# Patient Record
Sex: Female | Born: 1990 | Race: White | Hispanic: No | Marital: Married | Smoking: Former smoker
Health system: Southern US, Community
[De-identification: ages and names within clinical notes are randomized; demographics above are authoritative.]

## PROBLEM LIST (undated history)

## (undated) DIAGNOSIS — F329 Major depressive disorder, single episode, unspecified: Secondary | ICD-10-CM

## (undated) DIAGNOSIS — F419 Anxiety disorder, unspecified: Secondary | ICD-10-CM

## (undated) DIAGNOSIS — F32A Depression, unspecified: Secondary | ICD-10-CM

## (undated) DIAGNOSIS — C801 Malignant (primary) neoplasm, unspecified: Secondary | ICD-10-CM

## (undated) HISTORY — PX: SKIN CANCER EXCISION: SHX779

## (undated) HISTORY — DX: Malignant (primary) neoplasm, unspecified: C80.1

---
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
## 2008-05-20 ENCOUNTER — Ambulatory Visit: Payer: Self-pay

## 2008-11-11 ENCOUNTER — Ambulatory Visit: Payer: Self-pay | Admitting: Internal Medicine

## 2010-01-05 ENCOUNTER — Ambulatory Visit: Payer: Self-pay | Admitting: Family Medicine

## 2011-03-15 ENCOUNTER — Emergency Department: Payer: Self-pay | Admitting: Unknown Physician Specialty

## 2014-07-14 ENCOUNTER — Ambulatory Visit: Payer: Self-pay | Admitting: Internal Medicine

## 2015-01-17 ENCOUNTER — Ambulatory Visit: Payer: Self-pay | Admitting: Physician Assistant

## 2015-01-17 IMAGING — CR DG KNEE COMPLETE 4+V*R*
4 series · 4 of 4 positions shown · non-contrast
Comparison: None.

CLINICAL DATA: Knee pain since an injury while skiing 2 days ago.

EXAM:
RIGHT KNEE - COMPLETE 4+ VIEW

[knee ap]
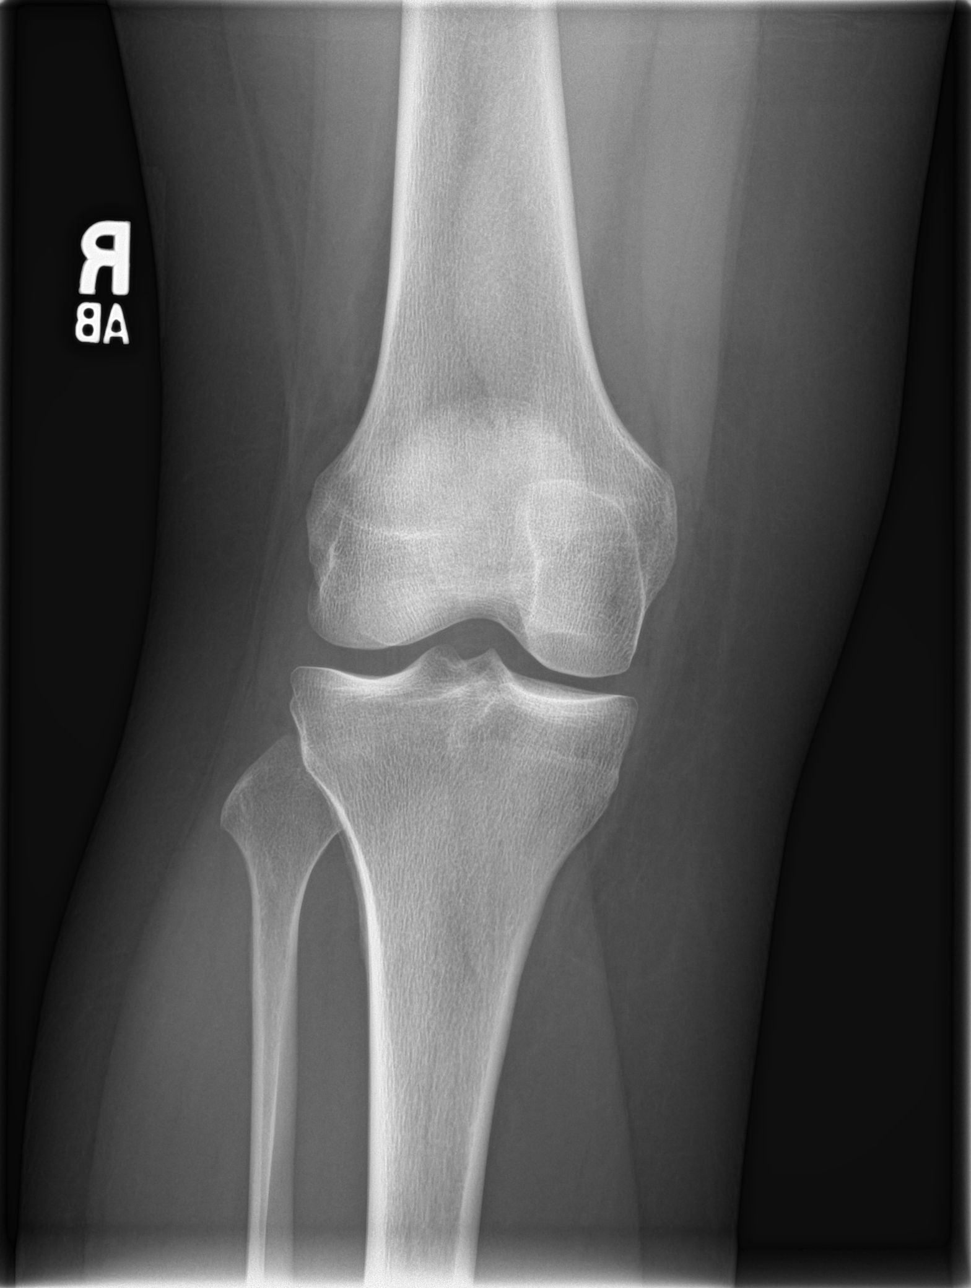

[knee obl (1 of 2)]
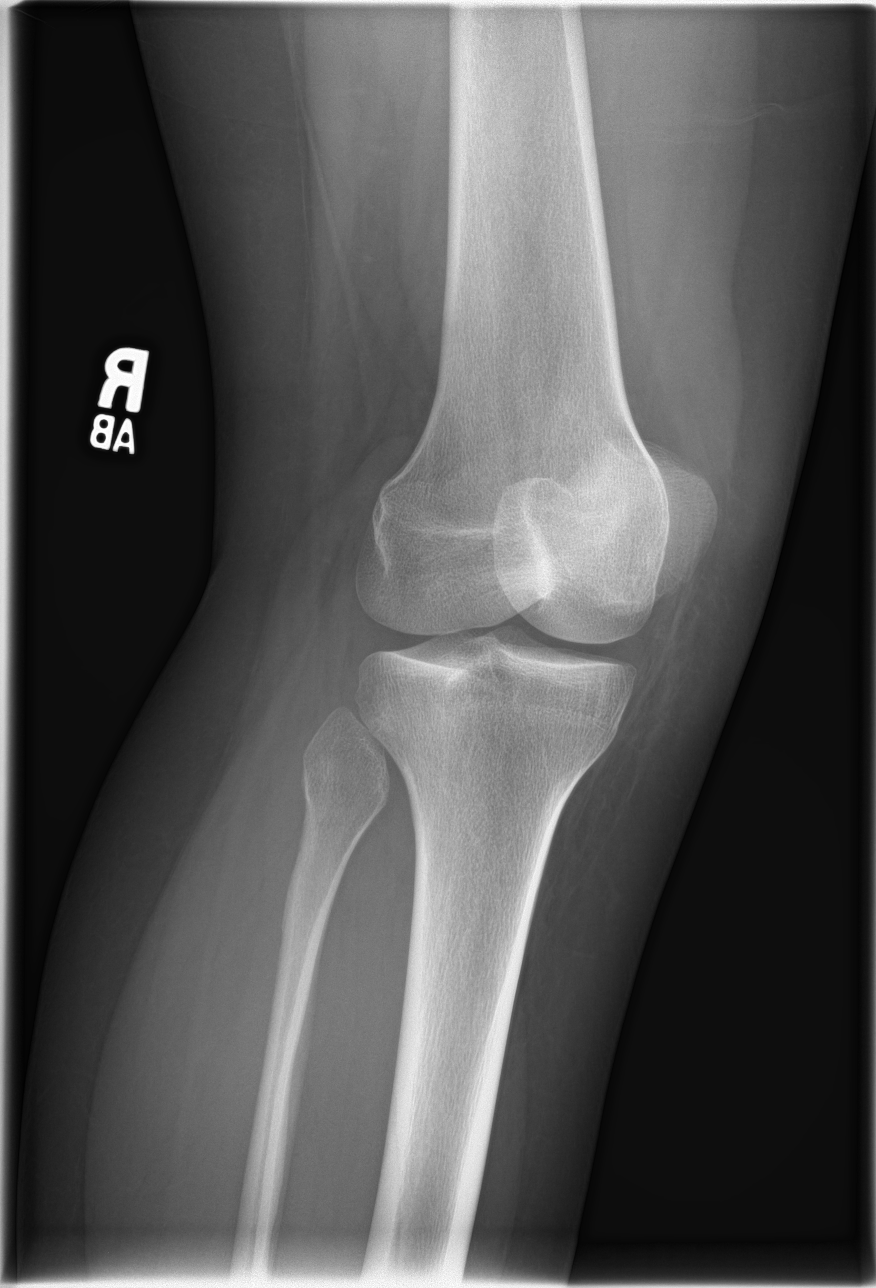

[knee obl (2 of 2)]
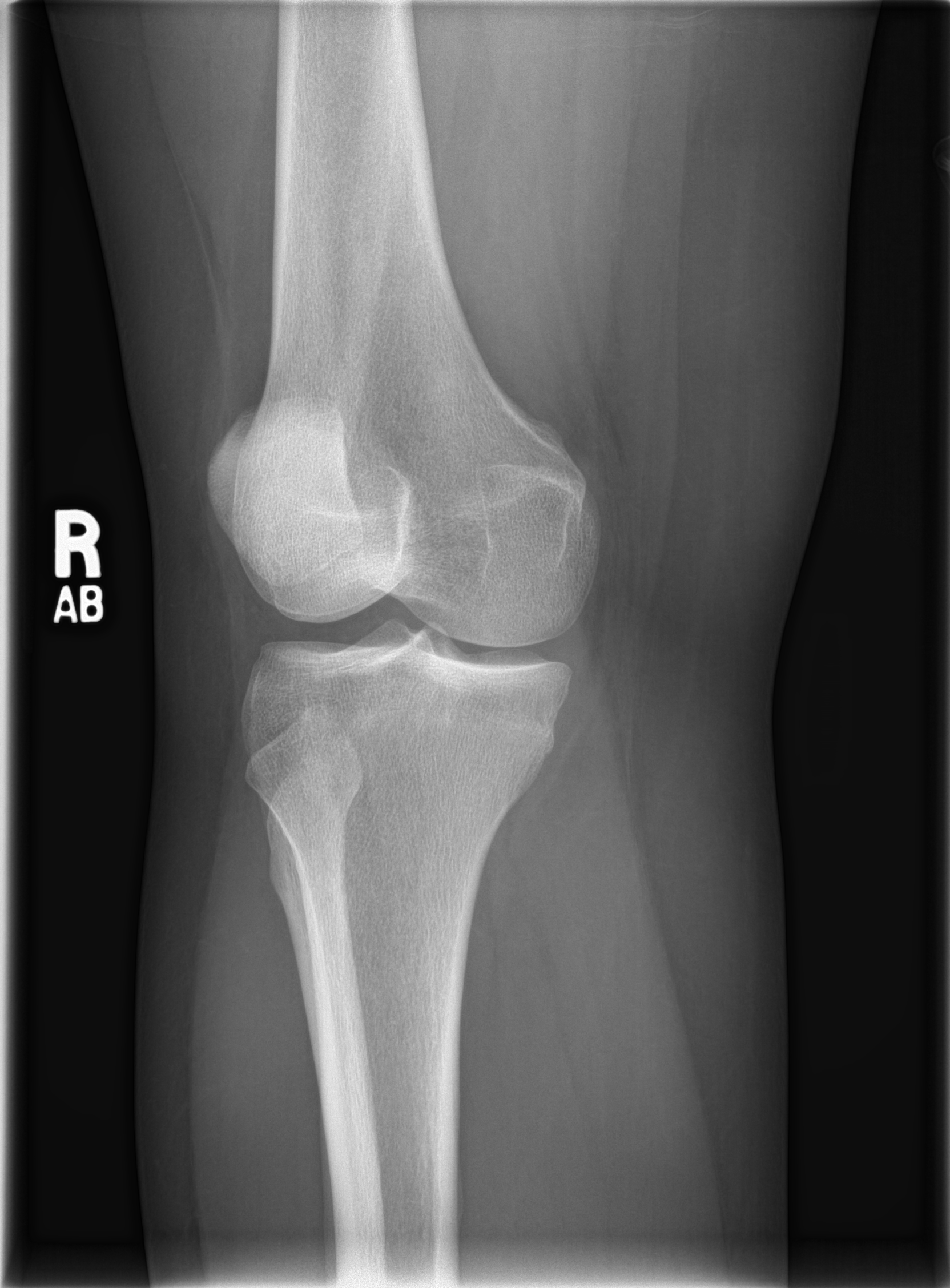

[knee lat]
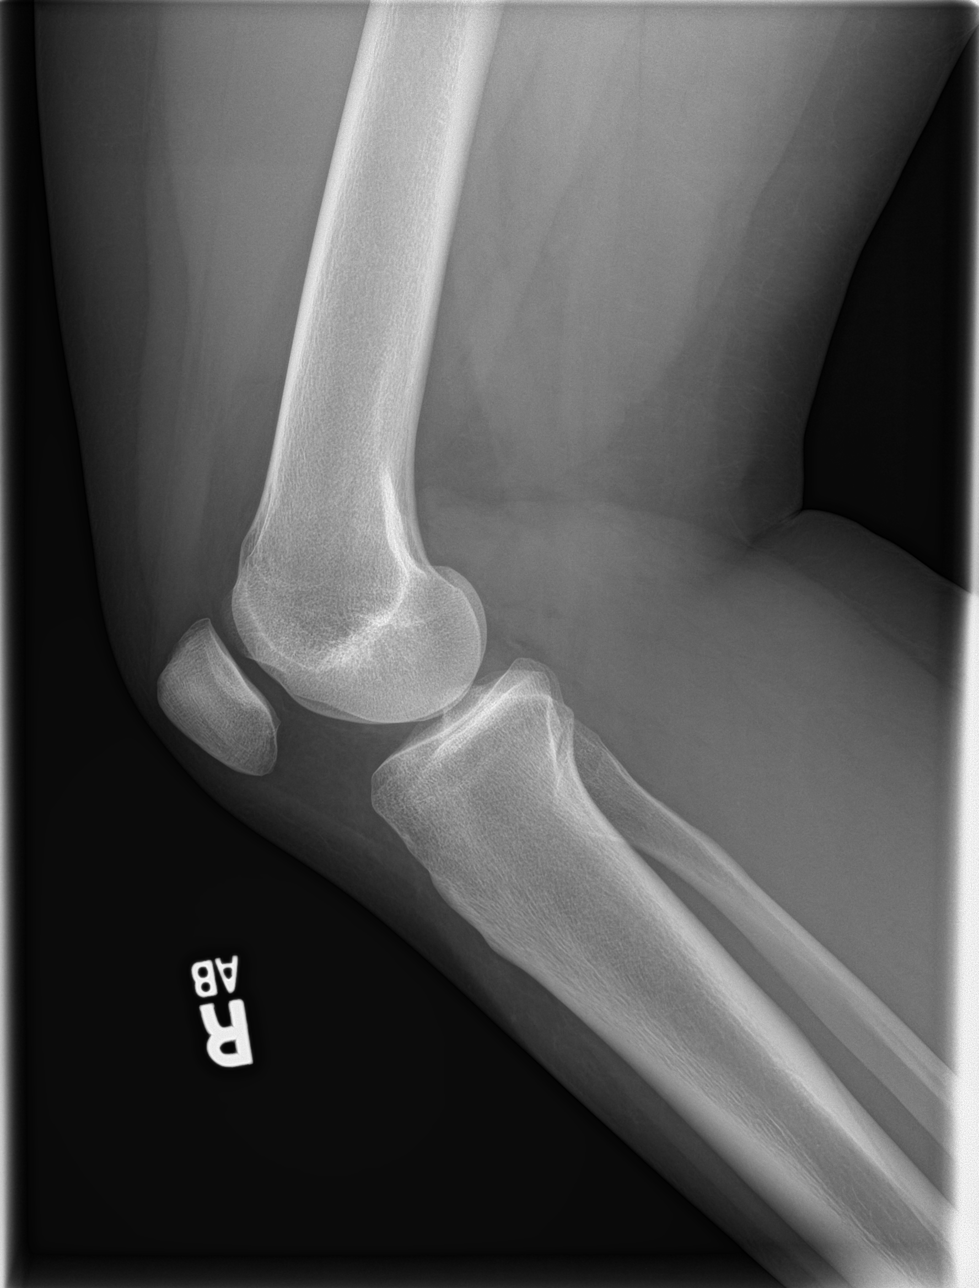

[4 of 4 positions shown; findings below may reference images not displayed]

FINDINGS: There is no evidence of fracture, dislocation, or joint effusion.
There is no evidence of arthropathy or other focal bone abnormality.
Soft tissues are unremarkable.
IMPRESSION: Normal exam.

## 2015-01-20 ENCOUNTER — Ambulatory Visit: Payer: Self-pay | Admitting: Physician Assistant

## 2015-03-31 ENCOUNTER — Encounter
Admit: 2015-03-31 | Disposition: A | Discharge status: Home / Self Care | Payer: Self-pay | Attending: Specialist | Admitting: Specialist

## 2015-04-01 ENCOUNTER — Encounter
Admit: 2015-04-01 | Disposition: A | Discharge status: Home / Self Care | Payer: Self-pay | Attending: Specialist | Admitting: Specialist

## 2015-04-11 ENCOUNTER — Encounter: Payer: Self-pay | Admitting: *Deleted

## 2015-05-03 ENCOUNTER — Encounter: Payer: Self-pay | Admitting: Physical Therapy

## 2015-05-05 ENCOUNTER — Encounter: Payer: Self-pay | Admitting: Physical Therapy

## 2015-05-09 ENCOUNTER — Encounter: Payer: Self-pay | Admitting: Physical Therapy

## 2015-09-05 ENCOUNTER — Emergency Department: Payer: 59

## 2015-09-05 ENCOUNTER — Encounter: Payer: Self-pay | Admitting: Emergency Medicine

## 2015-09-05 ENCOUNTER — Emergency Department
Admission: EM | Admit: 2015-09-05 | Discharge: 2015-09-05 | Disposition: A | Discharge status: Home / Self Care | Payer: 59 | Attending: Emergency Medicine | Admitting: Emergency Medicine

## 2015-09-05 DIAGNOSIS — R1012 Left upper quadrant pain: Secondary | ICD-10-CM | POA: Diagnosis present

## 2015-09-05 DIAGNOSIS — R109 Unspecified abdominal pain: Secondary | ICD-10-CM | POA: Insufficient documentation

## 2015-09-05 DIAGNOSIS — Z720 Tobacco use: Secondary | ICD-10-CM | POA: Insufficient documentation

## 2015-09-05 DIAGNOSIS — Z3202 Encounter for pregnancy test, result negative: Secondary | ICD-10-CM | POA: Insufficient documentation

## 2015-09-05 LAB — URINALYSIS COMPLETE WITH MICROSCOPIC (ARMC ONLY)
BILIRUBIN URINE: NEGATIVE
Glucose, UA: NEGATIVE mg/dL
KETONES UR: NEGATIVE mg/dL
LEUKOCYTES UA: NEGATIVE
NITRITE: NEGATIVE
PH: 5 (ref 5.0–8.0)
Protein, ur: NEGATIVE mg/dL
Specific Gravity, Urine: 1.024 (ref 1.005–1.030)

## 2015-09-05 LAB — CBC
HCT: 40.5 % (ref 35.0–47.0)
Hemoglobin: 13.9 g/dL (ref 12.0–16.0)
MCH: 32.1 pg (ref 26.0–34.0)
MCHC: 34.3 g/dL (ref 32.0–36.0)
MCV: 93.9 fL (ref 80.0–100.0)
PLATELETS: 270 10*3/uL (ref 150–440)
RBC: 4.31 MIL/uL (ref 3.80–5.20)
RDW: 12.1 % (ref 11.5–14.5)
WBC: 10.5 10*3/uL (ref 3.6–11.0)

## 2015-09-05 LAB — COMPREHENSIVE METABOLIC PANEL
ALK PHOS: 73 U/L (ref 38–126)
ALT: 21 U/L (ref 14–54)
AST: 26 U/L (ref 15–41)
Albumin: 4.6 g/dL (ref 3.5–5.0)
Anion gap: 7 (ref 5–15)
BILIRUBIN TOTAL: 0.4 mg/dL (ref 0.3–1.2)
BUN: 10 mg/dL (ref 6–20)
CALCIUM: 9.6 mg/dL (ref 8.9–10.3)
CO2: 24 mmol/L (ref 22–32)
CREATININE: 0.88 mg/dL (ref 0.44–1.00)
Chloride: 106 mmol/L (ref 101–111)
GFR calc Af Amer: >60 mL/min (ref >60)
Glucose, Bld: 123 mg/dL — ABNORMAL HIGH (ref 65–99)
Potassium: 3.5 mmol/L (ref 3.5–5.1)
Sodium: 137 mmol/L (ref 135–145)
TOTAL PROTEIN: 7.4 g/dL (ref 6.5–8.1)

## 2015-09-05 LAB — LIPASE, BLOOD: Lipase: 28 U/L (ref 22–51)

## 2015-09-05 LAB — POCT PREGNANCY, URINE: Preg Test, Ur: NEGATIVE

## 2015-09-05 MED ORDER — GI COCKTAIL ~~LOC~~: 30.0000 mL | Freq: Once | ORAL | Status: DC

## 2015-09-05 MED ORDER — IOHEXOL 240 MG/ML SOLN
25.0000 mL | Freq: Once | INTRAMUSCULAR | Status: AC | PRN
  Administered 2015-09-05: 25 mL via ORAL

## 2015-09-05 MED ORDER — IOHEXOL 350 MG/ML SOLN
100.0000 mL | Freq: Once | INTRAVENOUS | Status: AC | PRN
  Administered 2015-09-05: 100 mL via INTRAVENOUS

## 2015-09-05 MED ORDER — KETOROLAC TROMETHAMINE 30 MG/ML IJ SOLN
INTRAMUSCULAR | Status: AC
  Administered 2015-09-05: 30 mg via INTRAVENOUS
  Filled 2015-09-05: qty 1

## 2015-09-05 MED ORDER — ONDANSETRON HCL 4 MG/2ML IJ SOLN
4.0000 mg | Freq: Once | INTRAMUSCULAR | Status: AC
  Administered 2015-09-05: 4 mg via INTRAVENOUS
  Filled 2015-09-05: qty 2

## 2015-09-05 MED ORDER — KETOROLAC TROMETHAMINE 30 MG/ML IJ SOLN
30.0000 mg | Freq: Once | INTRAMUSCULAR | Status: AC
  Administered 2015-09-05: 30 mg via INTRAVENOUS

## 2015-09-05 MED ORDER — ONDANSETRON HCL 4 MG/2ML IJ SOLN
INTRAMUSCULAR | Status: AC
Start: 2015-09-05 — End: 2015-09-05
  Administered 2015-09-05: 4 mg via INTRAVENOUS
  Filled 2015-09-05: qty 2

## 2015-09-05 NOTE — ED Provider Notes (Signed)
[facility] Emergency Department Provider Note  ____________________________________________  Time seen: Approximately 5:28 PM  I have reviewed the triage vital signs and the nursing notes.   HISTORY  Chief Complaint Abdominal Pain    HPI Mandy Hughes is a 25 y.o. female patient is had about 2 weeks of intermittent sharp stabbing left upper quadrant pain that what occurred when she was up and moving but went away when she laid down. For the last 3 days this has become more constant again and she is up and moving around but resolves when she lays down. The pain is now radiating to the right shoulder and biceps area. Patient has previously tried toms a GI cocktail and Zantac without any relief. Patient is not really short of breath with this pain. She really does not have a cough with this pain. Eating doesn't seem to affect it either. Patient does smoke and use birth control.   History reviewed. No pertinent past medical history.  There are no active problems to display for this patient.   History reviewed. No pertinent past surgical history.  No current outpatient prescriptions on file.  Allergies Tape  History reviewed. No pertinent family history.  Social History Social History  Substance Use Topics  . Smoking status: Current Every Day Smoker  . Smokeless tobacco: None  . Alcohol Use: Yes    Review of Systems Constitutional: No fever/chills Eyes: No visual changes. ENT: No sore throat. Cardiovascular: See history of present illness Respiratory: Denies shortness of breath. Gastrointestinal: See history of present illness.  No nausea, no vomiting.  No diarrhea.  No constipation. Genitourinary: Negative for dysuria. Musculoskeletal: Negative for back pain. Skin: Negative for rash. Neurological: Negative for headaches, focal weakness or numbness.  10-point ROS otherwise  negative.  ____________________________________________   PHYSICAL EXAM:  VITAL SIGNS: ED Triage Vitals  Enc Vitals Group     BP 09/05/15 1706 144/87 mmHg     Pulse Rate 09/05/15 1706 117     Resp 09/05/15 1706 18     Temp 09/05/15 1706 98 F (36.7 C)     Temp Source 09/05/15 1706 Oral     SpO2 09/05/15 1706 98 %     Weight 09/05/15 1706 200 lb (90.719 kg)     Height 09/05/15 1706 5\' 3"  (1.6 m)     Head Cir --      Peak Flow --      Pain Score 09/05/15 1708 3     Pain Loc --      Pain Edu? --      Excl. in [place]? --     Constitutional: Alert and oriented. Well appearing and in no acute distress. Eyes: Conjunctivae are normal. PERRL. EOMI. Head: Atraumatic. Nose: No congestion/rhinnorhea. Mouth/Throat: Mucous membranes are moist.  Oropharynx non-erythematous. Neck: No stridor.   Cardiovascular: Normal rate, regular rhythm. Grossly normal heart sounds.  Good peripheral circulation. Respiratory: Normal respiratory effort.  No retractions. Lungs CTAB. Gastrointestinal: Soft and nontender. No distention. No abdominal bruits. No CVA tenderness. Musculoskeletal: No lower extremity tenderness nor edema.  No joint effusions. Neurologic:  Normal speech and language. No gross focal neurologic deficits are appreciated. No gait instability. Skin:  Skin is warm, dry and intact. No rash noted. Psychiatric: Mood and affect are normal. Speech and behavior are normal.  ____________________________________________   LABS (all labs ordered are listed, but only abnormal results are displayed)  Labs Reviewed  COMPREHENSIVE METABOLIC PANEL - Abnormal; Notable for the following:  Glucose, Bld 123 (*)    All other components within normal limits  URINALYSIS COMPLETEWITH MICROSCOPIC ([facility] ONLY) - Abnormal; Notable for the following:    Color, Urine YELLOW (*)    APPearance HAZY (*)    Hgb urine dipstick 1+ (*)    Bacteria, UA RARE (*)    Squamous Epithelial / LPF 0-5 (*)    All other  components within normal limits  LIPASE, BLOOD  CBC  TROPONIN I  PREGNANCY, URINE  URINALYSIS COMPLETEWITH MICROSCOPIC ([facility] ONLY)  POCT PREGNANCY, URINE   ____________________________________________  EKG  EKG read and interpreted by me shows normal sinus rhythm rate of 98 normal axis no acute changes ____________________________________________  RADIOLOGY  CT of the chest and belly show essentially nothing acute going on. There is a small hypodensity presumed to be benign by the radiologist in the liver. ____________________________________________   PROCEDURES   ____________________________________________   INITIAL IMPRESSION / ASSESSMENT AND PLAN / ED COURSE  Pertinent labs & imaging results that were available during my care of the patient were reviewed by me and considered in my medical decision making (see chart for details).  Discussed the labs and radiology studies with the patient in detail will give her something for nausea if needed and we'll try some Toradol 14 times a day for next 4 days around the clock whether it's hurting or not to see if that relieves any possible inflammation or muscle strain she may be having ____________________________________________   FINAL CLINICAL IMPRESSION(S) / ED DIAGNOSES  Final diagnoses:  Flank pain      Nena Polio, MD 09/05/15 2113

## 2015-09-05 NOTE — ED Notes (Signed)
Reports LUQ pain for several days.  Improves temporarily after eating, today radiating to [employer] area

## 2015-09-05 NOTE — Discharge Instructions (Signed)
Abdominal Pain, Women °Abdominal (stomach, pelvic, or belly) pain can be caused by many things. It is important to tell your doctor: °· The location of the pain. °· Does it come and go or is it present all the time? °· Are there things that start the pain (eating certain foods, exercise)? °· Are there other symptoms associated with the pain (fever, nausea, vomiting, diarrhea)? °All of this is helpful to know when trying to find the cause of the pain. °CAUSES  °· Stomach: virus or bacteria infection, or ulcer. °· Intestine: appendicitis (inflamed appendix), regional ileitis (Crohn's disease), ulcerative colitis (inflamed colon), irritable bowel syndrome, diverticulitis (inflamed diverticulum of the colon), or cancer of the stomach or intestine. °· Gallbladder disease or stones in the gallbladder. °· Kidney disease, kidney stones, or infection. °· Pancreas infection or cancer. °· Fibromyalgia (pain disorder). °· Diseases of the female organs: °¨ Uterus: fibroid (non-cancerous) tumors or infection. °¨ Fallopian tubes: infection or tubal pregnancy. °¨ Ovary: cysts or tumors. °¨ Pelvic adhesions (scar tissue). °¨ Endometriosis (uterus lining tissue growing in the pelvis and on the pelvic organs). °¨ Pelvic congestion syndrome (female organs filling up with blood just before the menstrual period). °¨ Pain with the menstrual period. °¨ Pain with ovulation (producing an egg). °¨ Pain with an IUD (intrauterine device, birth control) in the uterus. °¨ Cancer of the female organs. °· Functional pain (pain not caused by a disease, may improve without treatment). °· Psychological pain. °· Depression. °DIAGNOSIS  °Your doctor will decide the seriousness of your pain by doing an examination. °· Blood tests. °· X-rays. °· Ultrasound. °· CT scan (computed tomography, special type of X-ray). °· MRI (magnetic resonance imaging). °· Cultures, for infection. °· Barium enema (dye inserted in the large intestine, to better view it with  X-rays). °· Colonoscopy (looking in intestine with a lighted tube). °· Laparoscopy (minor surgery, looking in abdomen with a lighted tube). °· Major abdominal exploratory surgery (looking in abdomen with a large incision). °TREATMENT  °The treatment will depend on the cause of the pain.  °· Many cases can be observed and treated at home. °· Over-the-counter medicines recommended by your caregiver. °· Prescription medicine. °· Antibiotics, for infection. °· Birth control pills, for painful periods or for ovulation pain. °· Hormone treatment, for endometriosis. °· Nerve blocking injections. °· Physical therapy. °· Antidepressants. °· Counseling with a psychologist or psychiatrist. °· Minor or major surgery. °HOME CARE INSTRUCTIONS  °· Do not take laxatives, unless directed by your caregiver. °· Take over-the-counter pain medicine only if ordered by your caregiver. Do not take aspirin because it can cause an upset stomach or bleeding. °· Try a clear liquid diet (broth or water) as ordered by your caregiver. Slowly move to a bland diet, as tolerated, if the pain is related to the stomach or intestine. °· Have a thermometer and take your temperature several times a day, and record it. °· Bed rest and sleep, if it helps the pain. °· Avoid sexual intercourse, if it causes pain. °· Avoid stressful situations. °· Keep your follow-up appointments and tests, as your caregiver orders. °· If the pain does not go away with medicine or surgery, you may try: °¨ Acupuncture. °¨ Relaxation exercises (yoga, meditation). °¨ Group therapy. °¨ Counseling. °SEEK MEDICAL CARE IF:  °· You notice certain foods cause stomach pain. °· Your home care treatment is not helping your pain. °· You need stronger pain medicine. °· You want your IUD removed. °· You feel faint or   lightheaded.  You develop nausea and vomiting.  You develop a rash.  You are having side effects or an allergy to your medicine. SEEK IMMEDIATE MEDICAL CARE IF:   Your  pain does not go away or gets worse.  You have a fever.  Your pain is felt only in portions of the abdomen. The right side could possibly be appendicitis. The left lower portion of the abdomen could be colitis or diverticulitis.  You are passing blood in your stools (bright red or black tarry stools, with or without vomiting).  You have blood in your urine.  You develop chills, with or without a fever.  You pass out. MAKE SURE YOU:   Understand these instructions.  Will watch your condition.  Will get help right away if you are not doing well or get worse. Document Released: 10/14/2007 Document Revised: 05/03/2014 Document Reviewed: 11/03/2009 [facility] Patient Information 2015 [place], [place]. This information is not intended to replace advice given to you by your health care provider. Make sure you discuss any questions you have with your health care provider. Try the Toradol 10 mg one pill 4 times a day with food for the next 3-4 days with her having the pain or not. Return for worse pain fever vomiting or any new symptoms including shortness of breath. Stop smoking! Please follow-up with your doctor to be sure nothing new changes. Do not take the Toradol with Motrin meloxicam Naprosyn or any other non-steroidal. Use Zofran ODT as needed for nausea

## 2015-11-11 ENCOUNTER — Encounter: Payer: Self-pay | Admitting: Emergency Medicine

## 2015-11-11 ENCOUNTER — Emergency Department
Admission: EM | Admit: 2015-11-11 | Discharge: 2015-11-12 | Disposition: A | Discharge status: Home / Self Care | Payer: 59 | Attending: Emergency Medicine | Admitting: Emergency Medicine

## 2015-11-11 DIAGNOSIS — H578 Other specified disorders of eye and adnexa: Secondary | ICD-10-CM | POA: Insufficient documentation

## 2015-11-11 DIAGNOSIS — H44001 Unspecified purulent endophthalmitis, right eye: Secondary | ICD-10-CM

## 2015-11-11 DIAGNOSIS — Z720 Tobacco use: Secondary | ICD-10-CM | POA: Insufficient documentation

## 2015-11-11 DIAGNOSIS — H5711 Ocular pain, right eye: Secondary | ICD-10-CM | POA: Diagnosis present

## 2015-11-11 NOTE — ED Notes (Signed)
Pt states she was watching TV and her eye started burning about 830pm. Pt states after work she took her contacts out and makeup off and about an hour later her eye started burning.

## 2015-11-12 MED ORDER — CIPROFLOXACIN HCL 0.3 % OP SOLN: 1.0000 [drp] | OPHTHALMIC | Status: AC

## 2015-11-12 NOTE — Discharge Instructions (Signed)
Bacterial Conjunctivitis    Bacterial conjunctivitis (commonly called pink eye) is redness, soreness, or puffiness (inflammation) of the white part of your eye. It is caused by a germ called bacteria. These germs can easily spread from person to person (contagious). Your eye often will become red or pink. Your eye may also become irritated, watery, or have a thick discharge.  HOME CARE  Apply a cool, clean washcloth over closed eyelids. Do this for 10-20 minutes, 3-4 times a day while you have pain.  Gently wipe away any fluid coming from the eye with a warm, wet washcloth or cotton ball.  Wash your hands often with soap and water. Use paper towels to dry your hands.  Do not share towels or washcloths.  Change or wash your pillowcase every day.  Do not use eye makeup until the infection is gone.  Do not use machines or drive if your vision is blurry.  Stop using contact lenses. Do not use them again until your doctor says it is okay.  Do not touch the tip of the eye drop bottle or medicine tube with your fingers when you put medicine on the eye. GET HELP RIGHT AWAY IF:  Your eye is not better after 3 days of starting your medicine.  You have a yellowish fluid coming out of the eye.  You have more pain in the eye.  Your eye redness is spreading.  Your vision becomes blurry.  You have a fever or lasting symptoms for more than 2-3 days.  You have a fever and your symptoms suddenly get worse.  You have pain in the face.  Your face gets red or puffy (swollen). MAKE SURE YOU:  Understand these instructions.  Will watch this condition.  Will get help right away if you are not doing well or get worse. This information is not intended to replace advice given to you by your health care provider. Make sure you discuss any questions you have with your health care provider.  Document Released: 09/25/2008 Document Revised: 12/03/2012 Document Reviewed: 08/22/2012  Elsevier Interactive Patient  Education 2016 [employer].   How to Use Eye Drops and Eye Ointments  HOW TO APPLY EYE DROPS  Follow these steps when applying eye drops:  1. Wash your hands. 2. Tilt your head back. 3. Put a finger under your eye and use it to gently pull your lower lid downward. Keep that finger in place. 4. Using your other hand, hold the dropper between your thumb and index finger. 5. Position the dropper just over the edge of the lower lid. Hold it as close to your eye as you can without touching the dropper to your eye. 6. Steady your hand. One way to do this is to lean your index finger against your brow. 7. Look up. 8. Slowly and gently squeeze one drop of medicine into your eye. 9. Close your eye. 10. Place a finger between your lower eyelid and your nose. Press gently for 2 minutes. This increases the amount of time that the medicine is exposed to the eye. It also reduces side effects that can develop if the drop gets into the bloodstream through the nose. HOW TO APPLY EYE OINTMENTS  Follow these steps when applying eye ointments:  1. Wash your hands. 2. Put a finger under your eye and use it to gently pull your lower lid downward. Keep that finger in place. 3. Using your other hand, place the tip of the tube between your thumb  and index finger with the remaining fingers braced against your cheek or nose. 4. Hold the tube just over the edge of your lower lid without touching the tube to your lid or eyeball. 5. Look up. 6. Line the inner part of your lower lid with ointment. 7. Gently pull up on your upper lid and look down. This will force the ointment to spread over the surface of the eye. 8. Release the upper lid. 9. If you can, close your eyes for 1-2 minutes. Do not rub your eyes. If you applied the ointment correctly, your vision will be blurry for a few minutes. This is normal.  ADDITIONAL INFORMATION  Make sure to use the eye drops or ointment as told by your health care provider.  If  you have been told to use both eye drops and an eye ointment, apply the eye drops first, then wait 3-4 minutes before you apply the ointment.  Try not to touch the tip of the dropper or tube to your eye. A dropper or tube that has touched the eye can become contaminated. This information is not intended to replace advice given to you by your health care provider. Make sure you discuss any questions you have with your health care provider.  Document Released: 03/25/2001 Document Revised: 05/03/2015 Document Reviewed: 12/13/2014  Elsevier Interactive Patient Education [employer].

## 2015-11-12 NOTE — ED Provider Notes (Signed)
[facility] Emergency Department Provider Note  ____________________________________________  Time seen: Approximately 10 PM 11/11/2015  I have reviewed the triage vital signs and the nursing notes.   HISTORY  Chief Complaint Eye Injury    HPI Mandy Hughes is a 25 y.o. female who presents to the emergency department complaining of right eye pain. The patient states that she was at home this evening watching TV when her eye began to "burn". She states that symptoms began approximately 8:30 this evening. She states that initially she believed it was either dry eyes or irritation from her contact lenses. She states that multiple blinking and rubbing of her eyes did not improve symptoms so she removed her contact. She states at that time she felt like there was "something flapping on my eye." She states that she also began to experience visual acuity changes to the right eye. 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    Patient denies any head trauma or eye trauma in the recent past. She denies any fevers or chills. She denies headache, left-sided visual acuity changes, fevers or chills, URI symptoms. Patient does wear contact lenses but states that entire lens was removed this evening. She reports that contacts are prolonged wear but she removes same every evening. She states that at this time the symptoms are best described as a burning sensation as well as a foreign body sensation. She has tried flushing eye with no relief.Pain and symptoms are constant, not relieved with anything but aggravated with eye movement and bright lights.   History reviewed. No pertinent past medical history.  There are no active problems to display for this patient.   History reviewed. No pertinent past surgical history.  Current Outpatient Rx  Name  Route  Sig  Dispense   Refill  . ciprofloxacin (CILOXAN) 0.3 % ophthalmic solution   Right Eye   Place 1 drop into the right eye every 2 (two) hours. Administer 1 drop, every 2 hours, while awake, for 2 days. Then 1 drop, every 4 hours, while awake, for the next 5 days.   5 mL   0     Allergies Tape  History reviewed. No pertinent family history.  Social History Social History  Substance Use Topics  . Smoking status: Current Every Day Smoker  . Smokeless tobacco: None  . Alcohol Use: Yes    Review of Systems Constitutional: No fever/chills Eyes: Endorses right-sided visual changes. Endorses right eye pain. ENT: No sore throat. Endorses right eye pain. Cardiovascular: Denies chest pain. Respiratory: Denies shortness of breath. Gastrointestinal: No abdominal pain.  No nausea, no vomiting.  No diarrhea.  No constipation. Genitourinary: Negative for dysuria. Musculoskeletal: Negative for back pain. Skin: Negative for rash. Neurological: Negative for headaches, focal weakness or numbness.  10-point ROS otherwise negative.  ____________________________________________   PHYSICAL EXAM:  VITAL SIGNS: ED Triage Vitals  Enc Vitals Group     BP 11/11/15 2238 144/81 mmHg     Pulse Rate 11/11/15 2238 95     Resp 11/11/15 2238 18     Temp 11/11/15 2238 98.2 F (36.8 C)     Temp Source 11/11/15 2238 Oral     SpO2 11/11/15 2238 98 %     Weight --      Height --      Head Cir --      Peak  Flow --      Pain Score 11/11/15 2239 9     Pain Loc --      Pain Edu? --      Excl. in [place]? --     Constitutional: Alert and oriented. Well appearing and in no acute distress. Eyes: Conjunctival inflammation noted upon initial inspection to right eye when compared with left. No visible drainage. Initial funduscopic exam reveals viscous fluid to lateral right globe. No visible foreign body is appreciated. No retained contact is visualized.. Red reflex intact. Optic vasculature and optic nerve with no acute  abnormality visualized. Tetracaine eyedrops were applied for local anesthetic purposes. Flouroscein staining reveals no acute area of uptake. PERRL. EOMI. Head: Atraumatic. Nose: No congestion/rhinnorhea. Mouth/Throat: Mucous membranes are moist.  Oropharynx non-erythematous. Neck: No stridor.   Hematological/Lymphatic/Immunilogical: No cervical lymphadenopathy. Cardiovascular: Normal rate, regular rhythm. Grossly normal heart sounds.  Good peripheral circulation. Respiratory: Normal respiratory effort.  No retractions. Lungs CTAB. Gastrointestinal: Soft and nontender. No distention. No abdominal bruits. No CVA tenderness. Musculoskeletal: No lower extremity tenderness nor edema.  No joint effusions. Neurologic:  Normal speech and language. No gross focal neurologic deficits are appreciated. No gait instability. Skin:  Skin is warm, dry and intact. No rash noted. Psychiatric: Mood and affect are normal. Speech and behavior are normal.  ____________________________________________   LABS (all labs ordered are listed, but only abnormal results are displayed)  Labs Reviewed - No data to display ____________________________________________  EKG   ____________________________________________  RADIOLOGY   ____________________________________________   PROCEDURES  Procedure(s) performed: None  Critical Care performed: No  ____________________________________________   INITIAL IMPRESSION / ASSESSMENT AND PLAN / ED COURSE  Pertinent labs & imaging results that were available during my care of the patient were reviewed by me and considered in my medical decision making (see chart for details).  The patient's history, symptoms, physical exam are taken and consideration with diagnosis. Patient presents with sudden onset of right eye pain and redness to the emergency department. The patient is a contact wearer. Patient states that entire contact was removed prior to arrival. Patient  is legally blind and right eye and uses prescription lenses at all times for distance vision. Patient was unable to read eye chart with uncorrected vision to right eye. Per the patient this is baseline for her. Patient was requested to use prescriptive glasses for visual acuity exam. Patient has a decrease in distance vision to 20/40 vision in right eye. Patient also reports that objects are "blurry". Patient's symptoms were concerning to the provider and further consultation with attending Dr. Joni Fears as well as ophthalmology was consulted by phone. Dr. Joni Fears examined patient using slit lamp. Dr. Jerene Canny exam was consistent with this providers with likely vitriolic fluid to the lateral globe. No acute changes to vasculature or optic disc were appreciated. Significant signs of inflammation were appreciated. Dr.Brasington with ophthalmology was contacted and advised of patient's symptoms, physical exam findings, and Concerta provider. Dr. Wallace Going advise provider to place patient on antibiotic eyedrops and have follow-up in the morning at 10:00 for further evaluation and treatment.  The patient was informed of findings and diagnosis. She verbalizes understanding of the diagnosis. I advised the patient to follow-up with Dr. Murvin Natal in the morning for further evaluation and treatment and she verbalizes understanding and compliance with same. The patient is not to use her contact lenses until ophthalmology visit. Patient verbalizes understanding of this. Patient discharged home with antibiotics. She is advised to use saline eyedrops for  additional symptomatic control until she can seem ophthalmology. ____________________________________________   FINAL CLINICAL IMPRESSION(S) / ED DIAGNOSES  Final diagnoses:  Eye infection, right      Darletta Moll, PA-C 11/12/15 0112  Carrie Mew, MD 11/12/15 [phone]

## 2015-11-12 NOTE — ED Notes (Signed)
Pt dc home ambulatory instructed on follow up plan and med use PT NAD AT DC

## 2015-12-20 ENCOUNTER — Ambulatory Visit: Payer: 59 | Attending: Orthopedic Surgery

## 2015-12-20 DIAGNOSIS — M25561 Pain in right knee: Secondary | ICD-10-CM | POA: Diagnosis present

## 2015-12-20 NOTE — Therapy (Signed)
[place] MAIN [facility] SERVICES [address] [place], [place], [address] Phone: [phone]   Fax:  [phone]  Physical Therapy Evaluation  Patient Details  Name: Alba Twedt MRN: [id number] Date of Birth: 1990/09/20 Referring Provider: Mack Guise   Encounter Date: 12/20/2015      PT End of Session - 12/20/15 0909    Visit Number 1   Number of Visits 17   Date for PT Re-Evaluation 01/17/16   PT Start Time 0805   PT Stop Time 0900   PT Time Calculation (min) 55 min   Equipment Utilized During Treatment --  R knee brace with lateral block\   Activity Tolerance Patient tolerated treatment well;No increased pain   Behavior During Therapy [facility] for tasks assessed/performed      Past Medical History  Diagnosis Date  . Cancer ([place])     basal cell carcinoma    No past surgical history on file.  There were no vitals filed for this visit.  Visit Diagnosis:  Right knee pain - Plan: PT plan of care cert/re-cert      Subjective Assessment - 12/20/15 0810    Subjective Pt reports she tried skiing almost a year ago where she began having medial knee pain. she was diagnosied with a grade 2 MCL tear. she has had some medial knee pain since then. Pt reports she started physical therapy and then started having anterior knee pain she reports with most movements. pt reports squatting and going up stairs increases her pain. pt denies any numbness/tlinging. she is wearing a knee brace.    Limitations Standing;Other (comment)  squatting   How long can you stand comfortably? 2hrs   Diagnostic tests MRI last year- grade 2 MCL sprain   Patient Stated Goals be able to work without pain. squat without pian   Currently in Pain? Yes   Pain Score 3    Pain Location --  R knee   Pain Descriptors / Indicators Burning            [facility] PT Assessment - 12/20/15 0816    Assessment   Medical Diagnosis R patellofemoral sydrome   Referring  Provider Mack Guise    Onset Date/Surgical Date 01/19/14   Precautions   Precautions None   Restrictions   Weight Bearing Restrictions No   Balance Screen   Has the patient fallen in the past 6 months No   Has the patient had a decrease in activity level because of a fear of falling?  No   Is the patient reluctant to leave their home because of a fear of falling?  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 [employer]   Sensation   Light Touch Appears Intact   Ambulation/Gait   Gait Pattern Lateral hip instability;Lateral trunk lean to left          PROM/AROM: WNL bilaterally  STRENGTH:  Graded on a 0-5 scale Muscle Group Left Right  Shoulder flex    Shoulder Abd    Shoulder Ext    Shoulder IR/ER    Elbow    Wrist/hand     Hip Flex 4 4  Hip Abd 4 4  Hip Add 4- 4-  Hip Ext 4 4-  Hip IR/ER 4- 4-  Knee Flex 4/ 4  Knee Ext 4 4  Ankle DF 5 5  Ankle PF 5 5   SENSATION: WNL  SPECIAL TESTS: McMurreys + R lachmans (-) Patellar compression test+ Lateral glide of patella noted with quad set R>L Bilateral patella lateral tilt Delayed VMO R>L (-) Obers   FUNCTIONAL MOBILITY: Hip IR with descending steps R>L  BALANCE:  Knee valgus/hip IR/ER with SLS R>L  palpation: Medial joint line of R knee tender                    PT Education - 12/20/15 0909    Education provided Yes   Education Details pathology, prognosis, POC   Person(s) Educated Patient   Methods Explanation   Comprehension Verbalized understanding             PT Long Term Goals - 12/20/15 0914    PT LONG TERM GOAL #1   Title pt will reduce LEFS to <25/80 disability, significantly improving function    Baseline 53/80    Time 8   Period Weeks   Status New   PT LONG TERM GOAL #2   Title pt will be independent and compliant with HEP   Time 4   Period Weeks   Status New   PT LONG TERM GOAL #3   Title pt will be able to tolerate working a shift with less than 3/10 R knee pain   Baseline 8/10   Time 8   Period Weeks   Status New   PT LONG TERM GOAL #4   Title pt will ascend and descend steps in step over step pattern with pain <3/10 and no knee valgus demonstrating improved strength    Time 8   Period Weeks   Status New               Plan - 12/20/15 0909    Clinical Impression Statement pt presents with chronic R knee pain at this point following the original ski injury 1 year ago. pt demonstrates bilateral R>L quad, glut, glut med weakness along with single leg instability with knee valgus. pt does have + McMurrys test on the R knee with popping palpated medially. pt would benefit from skilled PT services to address pain, weakness, instability, and patellar hypomobility.    Pt will benefit from skilled therapeutic intervention in order to improve on the following deficits Abnormal gait;Decreased strength;Hypomobility;Decreased activity tolerance;Pain   Rehab Potential Good   PT Frequency 2x / week   PT Duration 8 weeks   PT Treatment/Interventions Electrical Stimulation;Iontophoresis 4mg /ml Dexamethasone;Moist Heat;Ultrasound;Neuromuscular re-education;Balance training;Therapeutic exercise;Therapeutic activities;Functional mobility training;Stair training;Gait training;Manual techniques;Dry needling;Passive range of motion;Taping   PT Next Visit Plan closed chain strengthening         Problem List There are no active problems to display for this patient.  Gorden Harms. Fortunato Nordin, PT, DPT [phone]  Bryston Colocho 12/20/2015, 9:17 AM  [place] MAIN [facility] SERVICES [address] [place], [place], [address] Phone: [phone]   Fax:   [phone]  Name: Edilia Newbrough MRN: [id number] Date of Birth: 1990-05-23

## 2015-12-20 NOTE — Patient Instructions (Signed)
[email or web address] SLR with ER 2x15 Bridges 5s hold x 30 Adductor squeeze + quad set x 30 Lateral side steps green band x 30 steps each way

## 2015-12-29 ENCOUNTER — Ambulatory Visit: Payer: 59

## 2016-01-01 HISTORY — PX: LIPOMA EXCISION: SHX5283

## 2016-01-05 ENCOUNTER — Ambulatory Visit: Payer: 59 | Attending: Orthopedic Surgery

## 2016-01-05 DIAGNOSIS — M25561 Pain in right knee: Secondary | ICD-10-CM | POA: Insufficient documentation

## 2016-01-05 DIAGNOSIS — Z01419 Encounter for gynecological examination (general) (routine) without abnormal findings: Secondary | ICD-10-CM | POA: Diagnosis not present

## 2016-01-05 DIAGNOSIS — Z3041 Encounter for surveillance of contraceptive pills: Secondary | ICD-10-CM | POA: Diagnosis not present

## 2016-01-05 DIAGNOSIS — Z124 Encounter for screening for malignant neoplasm of cervix: Secondary | ICD-10-CM | POA: Diagnosis not present

## 2016-01-05 DIAGNOSIS — Z720 Tobacco use: Secondary | ICD-10-CM | POA: Diagnosis not present

## 2016-01-05 NOTE — Therapy (Signed)
[place] MAIN [facility] SERVICES [address] [place], [place], [address] Phone: [phone]   Fax:  [phone]  Physical Therapy Treatment  Patient Details  Name: Mandy Hughes MRN: [id number] Date of Birth: 07/21/1990 Referring Provider: Mack Guise   Encounter Date: 01/05/2016      PT End of Session - 01/05/16 0959    Visit Number 2   Number of Visits 17   Date for PT Re-Evaluation 01/17/16   PT Start Time 0802   PT Stop Time [id number]   PT Time Calculation (min) 42 min   Equipment Utilized During Treatment --  R knee brace with lateral block\   Activity Tolerance Patient tolerated treatment well;No increased pain   Behavior During Therapy [facility] for tasks assessed/performed      Past Medical History  Diagnosis Date  . Cancer ([place])     basal cell carcinoma    History reviewed. No pertinent past surgical history.  There were no vitals filed for this visit.  Visit Diagnosis:  Right knee pain      Subjective Assessment - 01/05/16 0956    Subjective pt reports she has been partially compliant with HEP- performing it 2x/week. she reports her knee has felt a little better at work.    Limitations Standing;Other (comment)  squatting   How long can you stand comfortably? 2hrs   Diagnostic tests MRI last year- grade 2 MCL sprain   Patient Stated Goals be able to work without pain. squat without pian   Currently in Pain? Yes   Pain Score 2    Pain Location --  knee   Pain Orientation Right      Nustep L 4 warm up no charge x 2 min Fwd step up with knee up no UE 6in 2x10 each leg- Lateral step down (eccentric) 4inch step 4x5  Sit to stand (to improve squat mechanics) with band around knees (red) 3x10 Side steps with red band around knees 75m x 3 laps SLS on AIREX: knee flexed to 90 deg 30s x 5 each leg TKE with ball on wall 3s x 15 each leg Clamshells 2x10 bilaterally Pt needs mod cues to reduce knee valgus and for proper  squat mechanics all exercises performed bilaterally                           PT Education - 01/05/16 0959    Education provided Yes   Education Details importance of compliance of HEP. importance of hip, quad, glut strength for knee pain and stability    Person(s) Educated Patient   Methods Explanation   Comprehension Verbalized understanding             PT Long Term Goals - 12/20/15 0914    PT LONG TERM GOAL #1   Title pt will reduce LEFS to <25/80 disability, significantly improving function    Baseline 53/80   Time 8   Period Weeks   Status New   PT LONG TERM GOAL #2   Title pt will be independent and compliant with HEP   Time 4   Period Weeks   Status New   PT LONG TERM GOAL #3   Title pt will be able to tolerate working a shift with less than 3/10 R knee pain   Baseline 8/10   Time 8   Period Weeks   Status New   PT LONG TERM GOAL #4   Title pt will  ascend and descend steps in step over step pattern with pain <3/10 and no knee valgus demonstrating improved strength    Time 8   Period Weeks   Status New               Plan - 01/05/16 1000    Clinical Impression Statement pt had mild knee pain with closed chain LE exercises, but was tolerable. pt needed min-mod cues for proper exercise performance with appropriate muscle activation. she reported accurate areas of muscle fatigue at end of session. encouraged her to be compliant with HEP   Pt will benefit from skilled therapeutic intervention in order to improve on the following deficits Abnormal gait;Decreased strength;Hypomobility;Decreased activity tolerance;Pain   Rehab Potential Good   PT Frequency 2x / week   PT Duration 8 weeks   PT Treatment/Interventions Electrical Stimulation;Iontophoresis 4mg /ml Dexamethasone;Moist Heat;Ultrasound;Neuromuscular re-education;Balance training;Therapeutic exercise;Therapeutic activities;Functional mobility training;Stair training;Gait  training;Manual techniques;Dry needling;Passive range of motion;Taping   PT Next Visit Plan closed chain strengthening        Problem List There are no active problems to display for this patient.  Gorden Harms. Amia Rynders, PT, DPT [phone]  Sherod Cisse 01/05/2016, 10:55 AM  [place] MAIN [facility] SERVICES [address] [place], [place], [address] Phone: [phone]   Fax:  [phone]  Name: Mandy Hughes MRN: [id number] Date of Birth: 01/07/1990

## 2016-01-09 ENCOUNTER — Ambulatory Visit: Payer: 59

## 2016-01-11 ENCOUNTER — Ambulatory Visit: Payer: 59

## 2016-01-11 DIAGNOSIS — M25561 Pain in right knee: Secondary | ICD-10-CM

## 2016-01-11 NOTE — Therapy (Signed)
[place] MAIN [facility] SERVICES [address] [place], [place], [address] Phone: [phone]   Fax:  [phone]  Physical Therapy Treatment  Patient Details  Name: Mandy Hughes MRN: [id number] Date of Birth: 11-08-1990 Referring Provider: Mack Guise   Encounter Date: 01/11/2016      PT End of Session - 01/11/16 1007    Visit Number 3   Number of Visits 17   Date for PT Re-Evaluation 01/17/16   PT Start Time 0905   PT Stop Time 1000   PT Time Calculation (min) 55 min   Equipment Utilized During Treatment --  R knee brace with lateral block\   Activity Tolerance Patient tolerated treatment well;No increased pain   Behavior During Therapy [facility] for tasks assessed/performed      Past Medical History  Diagnosis Date  . Cancer ([place])     basal cell carcinoma    History reviewed. No pertinent past surgical history.  There were no vitals filed for this visit.  Visit Diagnosis:  Right knee pain      Subjective Assessment - 01/11/16 1006    Subjective pt reports she is doing most of her HEP. she reports DOMS after last session. she reports muscle soreness in gluts, hamstrings and quads.   Limitations Standing;Other (comment)  squatting   How long can you stand comfortably? 2hrs   Diagnostic tests MRI last year- grade 2 MCL sprain   Patient Stated Goals be able to work without pain. squat without pian   Currently in Pain? No/denies      Rec bike x 2 min L 2 warm up Leg press- single leg 75lbs 2x12  Butt burners: Standing clamshells red band 5x5 Side steps x 10 m x 5 laps with red band Squat to stand on toes with red band 5x5  Lateral eccentric step downs 4inch step 3x10 bilaterally cues to reduce hip IR/knee valgus and keeping knee behind toes (more hip hinge) SLS on AIREX with ball toss knee flexed to 90 deg 3x15 each side Side lunge on BOSU x 15 each side  3 step jog / hold/land SLS on trampoline 2x1 min cues for  increased knee flexion at landing Tball bridge with bend knee 2x10 Full plank 10s x 5 cues for proper performance.  Pt requires min-mod verbal and tactile cues for proper exercise performance    piriformis stretch 30s x 2 Quad stretch 30s x 2                         PT Education - 01/11/16 1006    Education provided Yes   Education Details importance of LE strength to improve patellar tracking    Person(s) Educated Patient   Methods Explanation   Comprehension Verbalized understanding             PT Long Term Goals - 12/20/15 0914    PT LONG TERM GOAL #1   Title pt will reduce LEFS to <25/80 disability, significantly improving function    Baseline 53/80   Time 8   Period Weeks   Status New   PT LONG TERM GOAL #2   Title pt will be independent and compliant with HEP   Time 4   Period Weeks   Status New   PT LONG TERM GOAL #3   Title pt will be able to tolerate working a shift with less than 3/10 R knee pain   Baseline 8/10   Time  8   Period Weeks   Status New   PT LONG TERM GOAL #4   Title pt will ascend and descend steps in step over step pattern with pain <3/10 and no knee valgus demonstrating improved strength    Time 8   Period Weeks   Status New               Plan - 01/11/16 1007    Clinical Impression Statement pt demonstrates improved LE stability R>L with SLS and able to tolerate progression of LE stability and strengthening activities. she does still demonstrate glut, quad, hip, core and hamstring weakness.    Pt will benefit from skilled therapeutic intervention in order to improve on the following deficits Abnormal gait;Decreased strength;Hypomobility;Decreased activity tolerance;Pain   Rehab Potential Good   PT Frequency 2x / week   PT Duration 8 weeks   PT Treatment/Interventions Electrical Stimulation;Iontophoresis 4mg /ml Dexamethasone;Moist Heat;Ultrasound;Neuromuscular re-education;Balance training;Therapeutic  exercise;Therapeutic activities;Functional mobility training;Stair training;Gait training;Manual techniques;Dry needling;Passive range of motion;Taping   PT Next Visit Plan closed chain strengthening        Problem List There are no active problems to display for this patient.  Gorden Harms. Roemello Speyer, PT, DPT [phone]  Ivyrose Hashman 01/11/2016, 10:14 AM  [place] MAIN [facility] SERVICES [address] [place], [place], [address] Phone: [phone]   Fax:  [phone]  Name: Carlota Merwin MRN: [id number] Date of Birth: 07-28-1990

## 2016-01-12 ENCOUNTER — Ambulatory Visit: Payer: 59

## 2016-01-12 DIAGNOSIS — M25561 Pain in right knee: Secondary | ICD-10-CM

## 2016-01-12 NOTE — Therapy (Signed)
[place] MAIN [facility] SERVICES [address] [place], [place], [address] Phone: [phone]   Fax:  [phone]  Physical Therapy Treatment  Patient Details  Name: Mandy Hughes MRN: [id number] Date of Birth: Feb 02, 1990 Referring Provider: Mack Guise   Encounter Date: 01/12/2016      PT End of Session - 01/12/16 1015    Visit Number 4   Number of Visits 17   Date for PT Re-Evaluation 01/17/16   PT Start Time 0920   PT Stop Time 1000   PT Time Calculation (min) 40 min   Equipment Utilized During Treatment --  R knee brace with lateral block\   Activity Tolerance Patient tolerated treatment well;No increased pain   Behavior During Therapy [facility] for tasks assessed/performed      Past Medical History  Diagnosis Date  . Cancer ([place])     basal cell carcinoma    No past surgical history on file.  There were no vitals filed for this visit.  Visit Diagnosis:  Right knee pain      Subjective Assessment - 01/12/16 1010    Subjective pt reports her gluts, quads and back are sore after yesterdays session, mediated by heating pad. pt reports her knee doesnt bother her as much as it used to.    Limitations Standing;Other (comment)  squatting   How long can you stand comfortably? 2hrs   Diagnostic tests MRI last year- grade 2 MCL sprain   Patient Stated Goals be able to work without pain. squat without pian   Currently in Pain? Yes   Pain Score 3    Pain Location --  lower back      Therex: Nustep L 3 x 3 min LE only Resisted sideways walking 17lbs x 5 laps in mini squat Resisted retro walking 25lbs x 5 laps Fwd step up onto BOSU - finger tip for balance 3x10 each leg cues to reduce valgus collapse BOSU squat flat top up 2x10 cues for hip hinge and upright posture  Standing on BOSU in mini squat :athletic position" flat top down with med ball toss outside BOS (yellow med ball) 3x10 Standing resisted hip flexion, abduction  ,adduction and extension with   green     Band x 15 each way no UE support performed bilaterally. Cues for core stability Lateral eccentric step downs 3x10 on 4inch step performed bilaterally- cues to keep knee behind toes, cues to reduce knee valgus.                            PT Education - 01/11/16 1006    Education provided Yes   Education Details importance of LE strength to improve patellar tracking    Person(s) Educated Patient   Methods Explanation   Comprehension Verbalized understanding             PT Long Term Goals - 12/20/15 0914    PT LONG TERM GOAL #1   Title pt will reduce LEFS to <25/80 disability, significantly improving function    Baseline 53/80   Time 8   Period Weeks   Status New   PT LONG TERM GOAL #2   Title pt will be independent and compliant with HEP   Time 4   Period Weeks   Status New   PT LONG TERM GOAL #3   Title pt will be able to tolerate working a shift with less than 3/10 R knee pain  Baseline 8/10   Time 8   Period Weeks   Status New   PT LONG TERM GOAL #4   Title pt will ascend and descend steps in step over step pattern with pain <3/10 and no knee valgus demonstrating improved strength    Time 8   Period Weeks   Status New               Plan - 01/12/16 1015    Clinical Impression Statement pt continues to do well with progression of therapy exercises. she does still struggle to control valugs collapse R>L, however is able to correct with cuing. progressing exercises onto more unstable surfaces to improve LE stability. pt demonstrates likely core weakness with most exercises with difficulty stabilizing her trunk which may be the cause of her LBP.    Pt will benefit from skilled therapeutic intervention in order to improve on the following deficits Abnormal gait;Decreased strength;Hypomobility;Decreased activity tolerance;Pain   Rehab Potential Good   PT Frequency 2x / week   PT Duration 8 weeks   PT  Treatment/Interventions Electrical Stimulation;Iontophoresis 4mg /ml Dexamethasone;Moist Heat;Ultrasound;Neuromuscular re-education;Balance training;Therapeutic exercise;Therapeutic activities;Functional mobility training;Stair training;Gait training;Manual techniques;Dry needling;Passive range of motion;Taping   PT Next Visit Plan closed chain strengthening        Problem List There are no active problems to display for this patient.  Gorden Harms. Mandy Hughes, PT, DPT [phone]  Mandy Hughes 01/12/2016, 10:17 AM  [place] MAIN [facility] SERVICES [address] [place], [place], [address] Phone: [phone]   Fax:  [phone]  Name: Mandy Hughes MRN: [id number] Date of Birth: 1990-07-23

## 2016-01-17 ENCOUNTER — Ambulatory Visit: Payer: 59

## 2016-01-17 DIAGNOSIS — M25561 Pain in right knee: Secondary | ICD-10-CM | POA: Diagnosis not present

## 2016-01-17 NOTE — Therapy (Signed)
[place] MAIN [facility] SERVICES [address] [place], [place], [address] Phone: [phone]   Fax:  [phone]  Physical Therapy Treatment  Patient Details  Name: Mandy Hughes MRN: [id number] Date of Birth: October 27, 1990 Referring Provider: Mack Guise   Encounter Date: 01/17/2016      PT End of Session - 01/17/16 1104    Visit Number 5   Number of Visits 17   Date for PT Re-Evaluation 01/17/16   PT Start Time 1005   PT Stop Time 1045   PT Time Calculation (min) 40 min   Equipment Utilized During Treatment --  R knee brace with lateral block\   Activity Tolerance Patient tolerated treatment well;No increased pain   Behavior During Therapy [facility] for tasks assessed/performed      Past Medical History  Diagnosis Date  . Cancer ([place])     basal cell carcinoma    No past surgical history on file.  There were no vitals filed for this visit.  Visit Diagnosis:  Right knee pain      Subjective Assessment - 01/17/16 1054    Subjective Pt reports she has been having improved R anterior knee pain since starting therapy. She noted some increased knee soreness yesterday after a long shift but has since resolved. No reported pain at this time. Pt reports she is performing HEP without issue. No specific questions or concerns at this time.    Limitations Standing;Other (comment)  squatting   How long can you stand comfortably? 2hrs   Diagnostic tests MRI last year- grade 2 MCL sprain   Patient Stated Goals be able to work without pain. squat without pian   Currently in Pain? No/denies       Therex: Nustep 3 x 4 min during history; Resisted sideways GTB 6 laps x 2 with squats between steps; R single leg bridges with L hip flexion to avoid excessive lumbar flexion 2 x 10; Side knee planks 10 second hold, 15 sec rest x 3; BOSU forward and lateral lunges with 8# dumbbell 2 x 10 each; BOSU squat flat top up 2 x 10 cues for hip hinge and  upright posture   Standing on BOSU in mini squat :athletic position" flat top up with med ball toss outside BOS (yellow med ball) 60 seconds x 2 challenging pt to increase her depth of squat; Lateral eccentric step downs x10 on 6inch step performed with R stance, pt struggles with RLE instability weakness, cues to reduce knee valgus In hooklying assessed R knee medial and lateral stability and well as ACL integrity. No evidence of ligamentous instability noted; Palpation over lateral and distal R quadriceps is nontender without reproduction of anterior knee pain; Quantum R single leg press 90# 3 x 10;                           PT Education - 01/17/16 1102    Education provided Yes   Education Details Reinforced HEP   Person(s) Educated Patient   Methods Explanation   Comprehension Verbalized understanding             PT Long Term Goals - 12/20/15 0914    PT LONG TERM GOAL #1   Title pt will reduce LEFS to <25/80 disability, significantly improving function    Baseline 53/80   Time 8   Period Weeks   Status New   PT LONG TERM GOAL #2   Title pt will  be independent and compliant with HEP   Time 4   Period Weeks   Status New   PT LONG TERM GOAL #3   Title pt will be able to tolerate working a shift with less than 3/10 R knee pain   Baseline 8/10   Time 8   Period Weeks   Status New   PT LONG TERM GOAL #4   Title pt will ascend and descend steps in step over step pattern with pain <3/10 and no knee valgus demonstrating improved strength    Time 8   Period Weeks   Status New               Plan - 01/17/16 1105    Clinical Impression Statement Pt demonstrates good progress with therapy. She continues to demonstrate poor RLE stability in single leg stance with golfer pick-up and eccentric lateral step-downs. Pt encouraged to continue HEP and follow-up as scheduled.    Pt will benefit from skilled therapeutic intervention in order to improve on  the following deficits Abnormal gait;Decreased strength;Hypomobility;Decreased activity tolerance;Pain   Rehab Potential Good   PT Frequency 2x / week   PT Duration 8 weeks   PT Treatment/Interventions Electrical Stimulation;Iontophoresis 4mg /ml Dexamethasone;Moist Heat;Ultrasound;Neuromuscular re-education;Balance training;Therapeutic exercise;Therapeutic activities;Functional mobility training;Stair training;Gait training;Manual techniques;Dry needling;Passive range of motion;Taping   PT Next Visit Plan Repeat outcome measures/recert, closed chain strengthening   PT Home Exercise Plan As prescribed        Problem List There are no active problems to display for this patient.  Phillips Grout PT, DPT   Huprich,Jason 01/17/2016, 11:11 AM  [place] MAIN [facility] SERVICES [address] [place], [place], [address] Phone: [phone]   Fax:  [phone]  Name: Mandy Hughes MRN: [id number] Date of Birth: [birth year]

## 2016-01-25 ENCOUNTER — Ambulatory Visit: Payer: 59

## 2016-01-25 DIAGNOSIS — M25561 Pain in right knee: Secondary | ICD-10-CM | POA: Diagnosis not present

## 2016-01-25 NOTE — Addendum Note (Signed)
Addended by: Alease Medina C on: 01/25/2016 11:30 AM   Modules accepted: Orders

## 2016-01-25 NOTE — Therapy (Addendum)
[place] MAIN [facility] SERVICES [address] [place], [place], [address] Phone: [phone]   Fax:  [phone]  Physical Therapy Treatment  Patient Details  Name: Tenley Winward MRN: [id number] Date of Birth: 06-29-1990 Referring Provider: Mack Guise   Encounter Date: 01/25/2016      PT End of Session - 01/25/16 1113    Visit Number 6   Number of Visits 25   Date for PT Re-Evaluation 02/22/16   PT Start Time 1007   PT Stop Time 1050   PT Time Calculation (min) 43 min   Equipment Utilized During Treatment --  R knee brace with lateral block_0   Activity Tolerance Patient tolerated treatment well;No increased pain   Behavior During Therapy [facility] for tasks assessed/performed      Past Medical History  Diagnosis Date  . Cancer ([place])     basal cell carcinoma    History reviewed. No pertinent past surgical history.  There were no vitals filed for this visit.  Visit Diagnosis:  Right knee pain      Subjective Assessment - 01/25/16 1111    Subjective pt reports she has noticed a great improvement in her knee pain. she reports she is no longer having to take NSAID and can work her shift with little pain. she reports she feels like her R knee is 65% better than at the start of PT. she is having increased neck pain recently, likely from studying.    Limitations Standing;Other (comment)  squatting   How long can you stand comfortably? 2hrs   Diagnostic tests MRI last year- grade 2 MCL sprain   Patient Stated Goals be able to work without pain. squat without pian   Currently in Pain? Yes   Pain Score 5    Pain Location --  R neck        therex: Warm up on elliptical L 3 x 3 min LE only Leg press in fitness center: L 8 2x15. Single leg press 4 plates 2x10 bilaterally: min cues for reducing hip IR Side steps with red band around knees and green band around ankles 42f each way Monster walk with green band around knees x 560f fwd/retro Squat on BOSU flat top up with HHA - green band around knees 2x10 Single leg straight leg dead lift 1x10 with 6lb med ball, then 1x10 bilaterally without med ball min-mod tactile cues for proper performance and SBA for safety  lateral eccentric step down 3x10 bilaterally on 6inch step. Min cues for proper alignment  SLS on AIREX with 4lb med ball toss 3x5 bilaterally with 15-20deg knee flexion SL squat on TRX cords 4x5 bilaterally, cues for :LE alignment                         PT Education - 01/25/16 1113    Education provided Yes   Education Details plan of care, recommending further PT x 1 mo             PT Long Term Goals - 01/25/16 1116    PT LONG TERM GOAL #1   Title pt will reduce LEFS to <25/80 disability, significantly improving function    Baseline 53/80   Time 8   Period Weeks   Status Partially Met   PT LONG TERM GOAL #2   Title pt will be independent and compliant with HEP   Baseline pt does not perform HEP at frequency recommended by PT -  she is partially compliant    Time 4   Period Weeks   Status Partially Met   PT LONG TERM GOAL #3   Title pt will be able to tolerate working a shift with less than 3/10 R knee pain   Baseline 8/10   Time 8   Period Weeks   Status Achieved   PT LONG TERM GOAL #4   Title pt will ascend and descend steps in step over step pattern with pain <3/10 and no knee valgus demonstrating improved strength    Time 8   Period Weeks   Status Partially Met               Plan - 01/25/16 1113    Clinical Impression Statement PT did modify treatment today due to her neck pain. pt is progressing well with PT and is having significantly less knee pain and improved function at home and at work. She has partially met PT goals at this time and would benefit from continued skilled PT services to futher address pain, LE and core strength, LE stability to reduce pain and maximize function.    Pt will benefit  from skilled therapeutic intervention in order to improve on the following deficits Abnormal gait;Decreased strength;Hypomobility;Decreased activity tolerance;Pain   Rehab Potential Good   PT Frequency 2x / week   PT Duration 4 weeks   PT Treatment/Interventions Electrical Stimulation;Iontophoresis 36m/ml Dexamethasone;Moist Heat;Ultrasound;Neuromuscular re-education;Balance training;Therapeutic exercise;Therapeutic activities;Functional mobility training;Stair training;Gait training;Manual techniques;Dry needling;Passive range of motion;Taping   PT Next Visit Plan Repeat outcome measures/recert, closed chain strengthening   PT Home Exercise Plan As prescribed        Problem List There are no active problems to display for this patient.  AGorden Harms Tortorici, PT, DPT #[phone] Tortorici,Ashley 01/25/2016, 11:29 AM  [place]MAIN [facility] [address] [address]Phone: [phone]  Fax:  [phone] Name: KKennidee HeyneMRN: [id number] of Birth: 913-Jul-1991

## 2016-01-30 ENCOUNTER — Ambulatory Visit: Payer: 59

## 2016-01-30 DIAGNOSIS — M25561 Pain in right knee: Secondary | ICD-10-CM

## 2016-01-30 NOTE — Therapy (Signed)
[place] MAIN [facility] SERVICES [address] [place], [place], [address] Phone: [phone]   Fax:  [phone]  Physical Therapy Treatment  Patient Details  Name: Mandy Hughes MRN: [id number] Date of Birth: [birth year] Referring Provider: Mack Guise   Encounter Date: 01/30/2016      PT End of Session - 01/30/16 1726    Visit Number 7   Number of Visits 25   Date for PT Re-Evaluation 02/22/16   PT Start Time 3664   PT Stop Time 1600   PT Time Calculation (min) 45 min   Equipment Utilized During Treatment --  R knee brace with lateral block\   Activity Tolerance Patient tolerated treatment well;No increased pain   Behavior During Therapy [facility] for tasks assessed/performed      Past Medical History  Diagnosis Date  . Cancer ([place])     basal cell carcinoma    History reviewed. No pertinent past surgical history.  There were no vitals filed for this visit.  Visit Diagnosis:  Right knee pain      Subjective Assessment - 01/30/16 1721    Subjective pt reports her knee feels better than it has in 6 months, but it still does hurt a little. pt reports she sat with her legs crossed (usually hurts after about 5 min) for about 15 min before it was painful to the R knee.    Limitations Standing;Other (comment)  squatting   How long can you stand comfortably? 2hrs   Diagnostic tests MRI last year- grade 2 MCL sprain   Patient Stated Goals be able to work without pain. squat without pian   Currently in Pain? Yes   Pain Score 2    Pain Location Knee   Pain Orientation Right        Therex: Elliptical cross trainer x 3 min warm up no charge  squats with 10lb squat bar   single leg lunge 2x10 Side steps with green band 11f x 2 Standing resisted hip flexion, abduction ,adduction and extension with    green    Band x 15 each way Side plank lifts x 15 Tball bridge + HSC 2x10 Plank on ball 10s x 5 SLS with ball toss 3x10 BLE on  AIREX Pt requires min verbal and tactile cues for proper exercise performance                             PT Long Term Goals - 01/25/16 1116    PT LONG TERM GOAL #1   Title pt will reduce LEFS to <25/80 disability, significantly improving function    Baseline 53/80   Time 8   Period Weeks   Status Partially Met   PT LONG TERM GOAL #2   Title pt will be independent and compliant with HEP   Baseline pt does not perform HEP at frequency recommended by PT - she is partially compliant    Time 4   Period Weeks   Status Partially Met   PT LONG TERM GOAL #3   Title pt will be able to tolerate working a shift with less than 3/10 R knee pain   Baseline 8/10   Time 8   Period Weeks   Status Achieved   PT LONG TERM GOAL #4   Title pt will ascend and descend steps in step over step pattern with pain <3/10 and no knee valgus demonstrating improved strength    Time  8   Period Weeks   Status Partially Met               Plan - 01/30/16 1726    Clinical Impression Statement pt continues to progress well with strengthening and stability in PT. PT is progressing LE strength and core strength as tolerated. added resisted squats today without increased pain only muscle fatigue.    Pt will benefit from skilled therapeutic intervention in order to improve on the following deficits Abnormal gait;Decreased strength;Hypomobility;Decreased activity tolerance;Pain   Rehab Potential Good   PT Frequency 2x / week   PT Duration 4 weeks   PT Treatment/Interventions Electrical Stimulation;Iontophoresis 58m/ml Dexamethasone;Moist Heat;Ultrasound;Neuromuscular re-education;Balance training;Therapeutic exercise;Therapeutic activities;Functional mobility training;Stair training;Gait training;Manual techniques;Dry needling;Passive range of motion;Taping   PT Next Visit Plan Repeat outcome measures/recert, closed chain strengthening   PT Home Exercise Plan As prescribed         Problem List There are no active problems to display for this patient. AGorden Harms Indianna Boran, PT, DPT #[phone]  Elisea Khader 01/30/2016, 5:28 PM  CD[place]MAIN [facility]At[facility] HospitalSERVICES [address] [address]Phone: [phone]  Fax:  [phone] Name: KJahmiya GuidottiMRN: [id number] of Birth: 908-21-1991

## 2016-02-01 ENCOUNTER — Ambulatory Visit: Payer: 59 | Attending: Orthopedic Surgery

## 2016-02-01 DIAGNOSIS — M25561 Pain in right knee: Secondary | ICD-10-CM | POA: Insufficient documentation

## 2016-02-01 NOTE — Therapy (Signed)
[place] MAIN [facility] SERVICES [address] [place], [place], [address] Phone: [phone]   Fax:  [phone]  Physical Therapy Treatment  Patient Details  Name: Mandy Hughes MRN: [id number] Date of Birth: May 22, 1990 Referring Provider: Mack Guise   Encounter Date: 02/01/2016      PT End of Session - 02/01/16 1316    Visit Number 8   Number of Visits 25   Date for PT Re-Evaluation 02/22/16   PT Start Time 1000   PT Stop Time 1044   PT Time Calculation (min) 44 min   Equipment Utilized During Treatment --  R knee brace with lateral block\   Activity Tolerance Patient tolerated treatment well;No increased pain   Behavior During Therapy [facility] for tasks assessed/performed      Past Medical History  Diagnosis Date  . Cancer ([place])     basal cell carcinoma    History reviewed. No pertinent past surgical history.  There were no vitals filed for this visit.  Visit Diagnosis:  Right knee pain      Subjective Assessment - 02/01/16 1315    Subjective pt reports her R knee is a little sore today. she reports it feels like its grinding a little. She reports having a hard 12hour shift yesterday.   Limitations Standing;Other (comment)  squatting   How long can you stand comfortably? 2hrs   Diagnostic tests MRI last year- grade 2 MCL sprain   Patient Stated Goals be able to work without pain. squat without pian   Currently in Pain? Yes   Pain Score 2    Pain Location --  R knee       Therex: Elliptical cross trainer L6resistance x 3 min no charge warm up Single leg sit to stand x 10 each leg Single leg squat on TRX cords 2x10 each leg Side steps with green tubing 2x79f each way BOSU squats flat top up with brief hold- green band around knees 3x10 SLS on BOSU 30s x 3 bilaterally 20deg knee flexion Standing clamshells 2x10 bilaterally with green band Tball bridges with bend knees x 15 Tball bridges with HSC x 15 SL  eccentric step down on 4inch step 2x10 bilaterally with resistance into hip ER using yellow Tband  Pt requires min verbal and tactile cues for proper exercise performance  Cues to reduce knee valgus   manual therapy: Grade III patella mobilizations on the RLE x 3 min focusing on medially                            PT Long Term Goals - 01/25/16 1116    PT LONG TERM GOAL #1   Title pt will reduce LEFS to <25/80 disability, significantly improving function    Baseline 53/80   Time 8   Period Weeks   Status Partially Met   PT LONG TERM GOAL #2   Title pt will be independent and compliant with HEP   Baseline pt does not perform HEP at frequency recommended by PT - she is partially compliant    Time 4   Period Weeks   Status Partially Met   PT LONG TERM GOAL #3   Title pt will be able to tolerate working a shift with less than 3/10 R knee pain   Baseline 8/10   Time 8   Period Weeks   Status Achieved   PT LONG TERM GOAL #4   Title pt will  ascend and descend steps in step over step pattern with pain <3/10 and no knee valgus demonstrating improved strength    Time 8   Period Weeks   Status Partially Met               Plan - 02/01/16 1318    Clinical Impression Statement pt did have a little R knee pain during treatment today, however was able to tolerate treatment without needing to modify. pt still does need intermittant cuing to externally rotate the hip R>L during single leg activities which is likely due to hip weakness. pt reports accurate reports of muscle fatigue with corresponding exercises., Also up on exam pt demonstrates improved patellar tracking and quadriceps activation with quad set. no longer has VMO lag.    Pt will benefit from skilled therapeutic intervention in order to improve on the following deficits Abnormal gait;Decreased strength;Hypomobility;Decreased activity tolerance;Pain   Rehab Potential Good   PT Frequency 2x / week   PT  Duration 4 weeks   PT Treatment/Interventions Electrical Stimulation;Iontophoresis 60m/ml Dexamethasone;Moist Heat;Ultrasound;Neuromuscular re-education;Balance training;Therapeutic exercise;Therapeutic activities;Functional mobility training;Stair training;Gait training;Manual techniques;Dry needling;Passive range of motion;Taping   PT Next Visit Plan Repeat outcome measures/recert, closed chain strengthening   PT Home Exercise Plan As prescribed        Problem List There are no active problems to display for this patient.   Mandy Hughes 02/01/2016, 1:21 PM  CVincentMAIN [facility] LLCSERVICES [address] [address]Phone: [phone]  Fax:  [phone] Name: Mandy PirozziMRN: [id number] of Birth: 9Mar 07, 1991

## 2016-02-08 ENCOUNTER — Ambulatory Visit: Payer: 59

## 2016-02-08 DIAGNOSIS — M25561 Pain in right knee: Secondary | ICD-10-CM

## 2016-02-08 NOTE — Therapy (Signed)
[place] MAIN [facility] SERVICES [address] [place], [place], [address] Phone: [phone]   Fax:  [phone]  Physical Therapy Treatment  Patient Details  Name: Mandy Hughes MRN: [id number] Date of Birth: [birth year] Referring Provider: Mack Guise   Encounter Date: 02/08/2016      PT End of Session - 02/08/16 1632    Visit Number 8   Number of Visits 25   Date for PT Re-Evaluation 02/22/16   PT Start Time 8916   PT Stop Time 1605   PT Time Calculation (min) 50 min   Equipment Utilized During Treatment --  R knee brace with lateral block_0   Activity Tolerance Patient tolerated treatment well;No increased pain   Behavior During Therapy [facility] for tasks assessed/performed      Past Medical History  Diagnosis Date  . Cancer ([place])     basal cell carcinoma    History reviewed. No pertinent past surgical history.  There were no vitals filed for this visit.  Visit Diagnosis:  Right knee pain      Subjective Assessment - 02/08/16 1629    Subjective pt  reports her knees have been hurting more lately. she is not sure why. she reports both knees hurt when walking and working.    Limitations Standing;Other (comment)  squatting   How long can you stand comfortably? 2hrs   Diagnostic tests MRI last year- grade 2 MCL sprain   Patient Stated Goals be able to work without pain. squat without pian   Currently in Pain? Yes   Pain Score 2    Pain Location --  B knees anterio-medially      Therex: Cross trainer warmup x 2 min  L 5 IT band roll out on foam roll 1 min x 2 each side PT palpated ITB and knee- pt was tender to each and patellar tendon Reduced flexibility to the quad and IT band Mini squats with 15lbs 2x10 Straight leg dead lift 5lbs 2  x10 Self soft tissue massage to IT band using the stick 1 min x 2  Manual therapy Soft tissue massage to distal IT band bilaterally Patella mobs medially grade 2 2 bouts 20s  IT band  stretch 30s x 2 bilaterally hipo flexor / quad stretch in prone 30s x 2 each                            PT Education - 02/08/16 1631    Education provided Yes   Education Details IT band and quad flexibility.    Person(s) Educated Patient   Methods Explanation   Comprehension Verbalized understanding             PT Long Term Goals - 01/25/16 1116    PT LONG TERM GOAL #1   Title pt will reduce LEFS to <25/80 disability, significantly improving function    Baseline 53/80   Time 8   Period Weeks   Status Partially Met   PT LONG TERM GOAL #2   Title pt will be independent and compliant with HEP   Baseline pt does not perform HEP at frequency recommended by PT - she is partially compliant    Time 4   Period Weeks   Status Partially Met   PT LONG TERM GOAL #3   Title pt will be able to tolerate working a shift with less than 3/10 R knee pain   Baseline 8/10   Time  8   Period Weeks   Status Achieved   PT LONG TERM GOAL #4   Title pt will ascend and descend steps in step over step pattern with pain <3/10 and no knee valgus demonstrating improved strength    Time 8   Period Weeks   Status Partially Met               Plan - 02/08/16 1633    Clinical Impression Statement pt demonstrates new onset of bilateral IT band tightness and tenderness R>L likely due to strenghening program. pt also has tenderness over patellar tendon and quad tightness. PT focus today was soft tissue mobilization of these areas and stretching. STM to the ITB was quite uncomfortable, however pt reports reduced knee pain following treatment.    Pt will benefit from skilled therapeutic intervention in order to improve on the following deficits Abnormal gait;Decreased strength;Hypomobility;Decreased activity tolerance;Pain   Rehab Potential Good   PT Frequency 2x / week   PT Duration 4 weeks   PT Treatment/Interventions Electrical Stimulation;Iontophoresis 1m/ml  Dexamethasone;Moist Heat;Ultrasound;Neuromuscular re-education;Balance training;Therapeutic exercise;Therapeutic activities;Functional mobility training;Stair training;Gait training;Manual techniques;Dry needling;Passive range of motion;Taping   PT Next Visit Plan Repeat outcome measures/recert, closed chain strengthening   PT Home Exercise Plan As prescribed        Problem List There are no active problems to display for this patient.  AGorden Harms Mandy Hughes, PT, DPT #[id number] Mandy Hughes 02/08/2016, 4:36 PM  CCal[place]ounMAIN RAdvanced [facility] LLCSERVICES [address] [place] [address]Phone: [phone]  Fax:  [phone] Name: KMisao FackrellMRN: [id number] of Birth: 9April 26, 1991

## 2016-02-14 ENCOUNTER — Ambulatory Visit: Payer: 59

## 2016-02-14 DIAGNOSIS — M25561 Pain in right knee: Secondary | ICD-10-CM

## 2016-02-14 NOTE — Therapy (Signed)
[place] MAIN [facility] SERVICES [address] [place], [place], [address] Phone: [phone]   Fax:  [phone]  Physical Therapy Treatment  Patient Details  Name: Markeda Narvaez MRN: [id number] Date of Birth: [birth year] Referring Provider: Mack Guise   Encounter Date: 02/14/2016      PT End of Session - 02/14/16 1342    Visit Number 10   Number of Visits 25   Date for PT Re-Evaluation 02/22/16   PT Start Time 0802   PT Stop Time 9379   PT Time Calculation (min) 42 min   Equipment Utilized During Treatment --  R knee brace with lateral block\   Activity Tolerance Patient tolerated treatment well;No increased pain   Behavior During Therapy [facility] for tasks assessed/performed      Past Medical History  Diagnosis Date  . Cancer ([place])     basal cell carcinoma    History reviewed. No pertinent past surgical history.  There were no vitals filed for this visit.  Visit Diagnosis:  Right knee pain      Subjective Assessment - 02/14/16 1335    Subjective pt reports her knees doing hurt as bad as they did last week, but they are still hurting more than 2 weeks ago.    Limitations Standing;Other (comment)  squatting   How long can you stand comfortably? 2hrs   Diagnostic tests MRI last year- grade 2 MCL sprain   Patient Stated Goals be able to work without pain. squat without pian   Currently in Pain? Yes   Pain Score 2    Pain Location --  B knees   Pain Orientation Right;Left   Pain Descriptors / Indicators Aching;Burning       Manual therapy: AISTM to bilateral ITB using the stick CFM to bilateral ITB insertion (lateral knee) Medial patellar mobs grade III 2x15s each side ITB stretch 20sx  3 each side Therex: Standing resisted hip flexion, abduction ,adduction and extension with blue     Band x 15 each way bilaterally Squat on BOSU 2x10 SLS on BOSU flat top 30s x 3 each side (20deg knee flexion) Mini squat on BOSU  flat down with med ball toss side to side: 1KG ball 3x10 Quad stretch 30s x 2 each leg leg press- single leg 75lbs 2x15 (1 set with hip ER) Pt requires min-mod verbal and tactile cues for proper exercise performance  Especially preventing knee valgus/hip IR                              PT Long Term Goals - 01/25/16 1116    PT LONG TERM GOAL #1   Title pt will reduce LEFS to <25/80 disability, significantly improving function    Baseline 53/80   Time 8   Period Weeks   Status Partially Met   PT LONG TERM GOAL #2   Title pt will be independent and compliant with HEP   Baseline pt does not perform HEP at frequency recommended by PT - she is partially compliant    Time 4   Period Weeks   Status Partially Met   PT LONG TERM GOAL #3   Title pt will be able to tolerate working a shift with less than 3/10 R knee pain   Baseline 8/10   Time 8   Period Weeks   Status Achieved   PT LONG TERM GOAL #4   Title pt will ascend and  descend steps in step over step pattern with pain <3/10 and no knee valgus demonstrating improved strength    Time 8   Period Weeks   Status Partially Met               Plan - 02/14/16 1343    Clinical Impression Statement pt is able to tolerate strengthening despite having some mild knee pain. pt is still tender and tight over bilateral IT band L>R. pt would benefit from continued skilled PT services to address strength, flexibility to improve patellar tracking and reduce pain. pt did have popping in the R knee during squatting  today.     Pt will benefit from skilled therapeutic intervention in order to improve on the following deficits Abnormal gait;Decreased strength;Hypomobility;Decreased activity tolerance;Pain   Rehab Potential Good   PT Frequency 2x / week   PT Duration 4 weeks   PT Treatment/Interventions Electrical Stimulation;Iontophoresis 34m/ml Dexamethasone;Moist Heat;Ultrasound;Neuromuscular re-education;Balance  training;Therapeutic exercise;Therapeutic activities;Functional mobility training;Stair training;Gait training;Manual techniques;Dry needling;Passive range of motion;Taping   PT Next Visit Plan Repeat outcome measures/recert, closed chain strengthening   PT Home Exercise Plan As prescribed        Problem List There are no active problems to display for this patient.  AGorden Harms Deniece Rankin, PT, DPT #[phone] Shanen Norris 02/14/2016, 2:05 PM  CRobinetteMAIN RIndiana University Health Bloom[facility] HospitalSERVICES [address] [place] [address]Phone: [phone]  Fax:  [phone] Name: KDessa LedeeMRN: [id number] of Birth: [birth year]

## 2016-02-16 ENCOUNTER — Ambulatory Visit: Payer: 59

## 2016-02-27 ENCOUNTER — Ambulatory Visit (INDEPENDENT_AMBULATORY_CARE_PROVIDER_SITE_OTHER): Payer: 59

## 2016-02-27 ENCOUNTER — Ambulatory Visit
Admission: EM | Admit: 2016-02-27 | Discharge: 2016-02-27 | Disposition: A | Discharge status: Home / Self Care | Payer: 59 | Attending: Family Medicine | Admitting: Family Medicine

## 2016-02-27 ENCOUNTER — Encounter: Payer: Self-pay | Admitting: Emergency Medicine

## 2016-02-27 DIAGNOSIS — S96911A Strain of unspecified muscle and tendon at ankle and foot level, right foot, initial encounter: Secondary | ICD-10-CM

## 2016-02-27 DIAGNOSIS — M79671 Pain in right foot: Secondary | ICD-10-CM

## 2016-02-27 NOTE — ED Notes (Signed)
Patient states that she stepped off a stool on Saturday.  Patient c/o pain on the outside of her right foot.

## 2016-02-27 NOTE — ED Provider Notes (Signed)
CSN: [id number]     Arrival date & time 02/27/16  1027 History   First MD Initiated Contact with Patient 02/27/16 1131    Nurses notes were reviewed. Chief Complaint  Patient presents with  . Foot Pain   Patient reports stepping off a stool the about 5 days ago and feeling sharp pain in the bottom of her right foot on the lateral side. She states that the pain has not gotten better and she is having difficulty walking and bleeding on her right foot. She denies hearing any crackles sounds but she works in the hospital emergency department as a nurse and concern about her shift upcoming being able to perform her duties.   No previous history of broken bones she does smoke. No significant family medical history for this illness.    (Consider location/radiation/quality/duration/timing/severity/associated sxs/prior Treatment) Patient is a 26 y.o. female presenting with lower extremity pain. The history is provided by the patient. No language interpreter was used.  Foot Pain This is a new problem. The current episode started more than 2 days ago. The problem occurs constantly. The problem has not changed since onset.Pertinent negatives include no chest pain, no abdominal pain, no headaches and no shortness of breath. Nothing aggravates the symptoms. Nothing relieves the symptoms. She has tried nothing for the symptoms. The treatment provided no relief.    Past Medical History  Diagnosis Date  . Cancer ([place])     basal cell carcinoma   History reviewed. No pertinent past surgical history. History reviewed. No pertinent family history. Social History  Substance Use Topics  . Smoking status: Current Every Day Smoker  . Smokeless tobacco: None  . Alcohol Use: Yes   OB History    No data available     Review of Systems  Respiratory: Negative for shortness of breath.   Cardiovascular: Negative for chest pain.  Gastrointestinal: Negative for abdominal pain.  Neurological: Negative for  headaches.  All other systems reviewed and are negative.   Allergies  Tape  Home Medications   Prior to Admission medications   Medication Sig Start Date End Date Taking? Authorizing Provider  norethindrone-ethinyl estradiol (MICROGESTIN,JUNEL,LOESTRIN) 1-20 MG-MCG tablet Take 1 tablet by mouth daily.   Yes Historical Provider, MD   Meds Ordered and Administered this Visit  Medications - No data to display  BP 145/73 mmHg  Pulse 80  Temp(Src) 98.4 F (36.9 C) (Oral)  Resp 16  Ht 5\' 3"  (1.6 m)  Wt 190 lb (86.183 kg)  BMI 33.67 kg/m2  SpO2 100%  LMP  No data found.   Physical Exam  Constitutional: She is oriented to person, place, and time. She appears well-developed and well-nourished.  HENT:  Head: Normocephalic.  Eyes: Conjunctivae are normal. Pupils are equal, round, and reactive to light.  Cardiovascular: Normal rate.   Musculoskeletal: Normal range of motion. She exhibits tenderness.       Right ankle: She exhibits normal range of motion and no swelling. No tenderness.       Right foot: There is tenderness.       Feet:  Neurological: She is alert and oriented to person, place, and time.  Skin: Skin is warm and dry. No rash noted.  Vitals reviewed.   ED Course  Procedures (including critical care time)  Labs Review Labs Reviewed - No data to display  Imaging Review Dg Foot Complete Right  02/27/2016  CLINICAL DATA:  Patient stepped off stool hitting foot 5 days prior with persistent pain  EXAM: RIGHT FOOT COMPLETE - 3+ VIEW COMPARISON:  None. FINDINGS: Frontal, oblique, and lateral views obtained. There is no demonstrable fracture or dislocation. Joint spaces appear intact. No erosive change. There is an os naviculare, an anatomic variant. IMPRESSION: No fracture or dislocation.  No appreciable arthropathy. Electronically Signed   By: Lowella Grip III M.D.   On: 02/27/2016 11:38     Visual Acuity Review  Right Eye Distance:   Left Eye Distance:     Bilateral Distance:    Right Eye Near:   Left Eye Near:    Bilateral Near:         MDM   1. Foot pain, right   2. Right foot strain, initial encounter    We'll try patient. Also she received that will help with ambulation that doesn't help make O2 a fracture boot but this time x-rays negative for fracture. She has been taking Advil recommend Mobic 15 mg she states she has no prescription at home for. She also has informed me that she had an appointment orthopedic this morning but the parenchyma canceled due to her orthopedic doctor being tied up in surgery.  Frederich Cha, MD 02/27/16 1225

## 2016-02-27 NOTE — Discharge Instructions (Signed)
Cryotherapy Cryotherapy is when you put ice on your injury. Ice helps lessen pain and puffiness (swelling) after an injury. Ice works the best when you start using it in the first 24 to 48 hours after an injury. HOME CARE  Put a dry or damp towel between the ice pack and your skin.  You may press gently on the ice pack.  Leave the ice on for no more than 10 to 20 minutes at a time.  Check your skin after 5 minutes to make sure your skin is okay.  Rest at least 20 minutes between ice pack uses.  Stop using ice when your skin loses feeling (numbness).  Do not use ice on someone who cannot tell you when it hurts. This includes small children and people with memory problems (dementia). GET HELP RIGHT AWAY IF:  You have white spots on your skin.  Your skin turns blue or pale.  Your skin feels waxy or hard.  Your puffiness gets worse. MAKE SURE YOU:   Understand these instructions.  Will watch your condition.  Will get help right away if you are not doing well or get worse.   This information is not intended to replace advice given to you by your health care provider. Make sure you discuss any questions you have with your health care provider.   Document Released: 06/04/2008 Document Revised: 03/10/2012 Document Reviewed: 08/09/2011 Elsevier Interactive Patient Education 2016 [place].  Muscle Strain A muscle strain is an injury that occurs when a muscle is stretched beyond its normal length. Usually a small number of muscle fibers are torn when this happens. Muscle strain is rated in degrees. First-degree strains have the least amount of muscle fiber tearing and pain. Second-degree and third-degree strains have increasingly more tearing and pain.  Usually, recovery from muscle strain takes 1-2 weeks. Complete healing takes 5-6 weeks.  CAUSES  Muscle strain happens when a sudden, violent force placed on a muscle stretches it too far. This may occur with lifting, sports, or a  fall.  RISK FACTORS Muscle strain is especially common in athletes.  SIGNS AND SYMPTOMS At the site of the muscle strain, there may be:  Pain.  Bruising.  Swelling.  Difficulty using the muscle due to pain or lack of normal function. DIAGNOSIS  Your health care provider will perform a physical exam and ask about your medical history. TREATMENT  Often, the best treatment for a muscle strain is resting, icing, and applying cold compresses to the injured area.  HOME CARE INSTRUCTIONS   Use the PRICE method of treatment to promote muscle healing during the first 2-3 days after your injury. The PRICE method involves:  Protecting the muscle from being injured again.  Restricting your activity and resting the injured body part.  Icing your injury. To do this, put ice in a plastic bag. Place a towel between your skin and the bag. Then, apply the ice and leave it on from 15-20 minutes each hour. After the third day, switch to moist heat packs.  Apply compression to the injured area with a splint or elastic bandage. Be careful not to wrap it too tightly. This may interfere with blood circulation or increase swelling.  Elevate the injured body part above the level of your heart as often as you can.  Only take over-the-counter or prescription medicines for pain, discomfort, or fever as directed by your health care provider.  Warming up prior to exercise helps to prevent future muscle strains. SEEK  MEDICAL CARE IF:   You have increasing pain or swelling in the injured area.  You have numbness, tingling, or a significant loss of strength in the injured area. MAKE SURE YOU:   Understand these instructions.  Will watch your condition.  Will get help right away if you are not doing well or get worse.   This information is not intended to replace advice given to you by your health care provider. Make sure you discuss any questions you have with your health care provider.   Document  Released: 12/17/2005 Document Revised: 10/07/2013 Document Reviewed: 07/16/2013 Elsevier Interactive Patient Education [employer].

## 2016-08-17 DIAGNOSIS — D1722 Benign lipomatous neoplasm of skin and subcutaneous tissue of left arm: Secondary | ICD-10-CM | POA: Diagnosis not present

## 2016-09-05 ENCOUNTER — Ambulatory Visit: Payer: Self-pay | Admitting: Family

## 2016-09-05 VITALS — BP 120/80 | HR 80 | Temp 98.4°F

## 2016-09-05 DIAGNOSIS — J069 Acute upper respiratory infection, unspecified: Secondary | ICD-10-CM

## 2016-09-05 MED ORDER — AZITHROMYCIN 250 MG PO TABS: ORAL_TABLET | ORAL | 0 refills | Status: DC

## 2016-09-05 NOTE — Progress Notes (Signed)
S one d hx ST, tonsils swollen, blowing yellow green, swabbed @ work neg for strep, malaise , dry cough Neg GI sxs  O/ VSS mildly ill ENT  Unremarkable,  Throat increased PNDneck supple, Heart rsr lungs clear A/ URI  P supportive measures . Rx to keep on hand if sxs persist past viral timeline. Zpack follow up prn not improving.

## 2016-09-18 DIAGNOSIS — D1722 Benign lipomatous neoplasm of skin and subcutaneous tissue of left arm: Secondary | ICD-10-CM | POA: Diagnosis not present

## 2016-09-18 DIAGNOSIS — R238 Other skin changes: Secondary | ICD-10-CM | POA: Diagnosis not present

## 2016-11-24 DIAGNOSIS — H52222 Regular astigmatism, left eye: Secondary | ICD-10-CM | POA: Diagnosis not present

## 2016-12-17 DIAGNOSIS — L918 Other hypertrophic disorders of the skin: Secondary | ICD-10-CM | POA: Diagnosis not present

## 2016-12-17 DIAGNOSIS — Z872 Personal history of diseases of the skin and subcutaneous tissue: Secondary | ICD-10-CM | POA: Diagnosis not present

## 2016-12-17 DIAGNOSIS — Z1283 Encounter for screening for malignant neoplasm of skin: Secondary | ICD-10-CM | POA: Diagnosis not present

## 2017-01-15 ENCOUNTER — Emergency Department
Admission: EM | Admit: 2017-01-15 | Discharge: 2017-01-16 | Disposition: A | Discharge status: Home / Self Care | Payer: 59 | Attending: Emergency Medicine | Admitting: Emergency Medicine

## 2017-01-15 ENCOUNTER — Emergency Department: Payer: 59

## 2017-01-15 ENCOUNTER — Encounter: Payer: Self-pay | Admitting: Emergency Medicine

## 2017-01-15 DIAGNOSIS — R42 Dizziness and giddiness: Secondary | ICD-10-CM | POA: Insufficient documentation

## 2017-01-15 DIAGNOSIS — F17200 Nicotine dependence, unspecified, uncomplicated: Secondary | ICD-10-CM | POA: Diagnosis not present

## 2017-01-15 DIAGNOSIS — Z79899 Other long term (current) drug therapy: Secondary | ICD-10-CM | POA: Diagnosis not present

## 2017-01-15 DIAGNOSIS — R079 Chest pain, unspecified: Secondary | ICD-10-CM | POA: Diagnosis not present

## 2017-01-15 DIAGNOSIS — R110 Nausea: Secondary | ICD-10-CM | POA: Diagnosis not present

## 2017-01-15 NOTE — ED Triage Notes (Signed)
Pt ambulatory to triage with steady gait, no distress noted. Pt c/o right sided chest pain that radiates to back, accompanied by dizziness and nausea x 1 day.

## 2017-01-15 NOTE — ED Provider Notes (Signed)
[facility] Emergency Department Provider Note   ____________________________________________   First MD Initiated Contact with Patient 01/15/17 2357     (approximate)  I have reviewed the triage vital signs and the nursing notes.   HISTORY  Chief Complaint Chest Pain    HPI Mandy Hughes is a 27 y.o. female who presents to the ED from home with a chief complain of chest pain. Patient reports onset of right sided chest pain one day ago; waxing/waning, initially sharp, now pressure-like sensation radiating into her shoulder blade.Symptoms associated with nausea and dizziness. Patient has been taking Zofran, Tums and ibuprofen without relief. Denies associated diaphoresis or shortness of breath. Reports recent sinus symptoms. Denies associated fever, chills, cough, congestion, abdominal pain, vomiting, dysuria, diarrhea. Denies recent travel or trauma. Does take OCPs and is a smoker. Nothing makes her symptoms better or worse.   Past Medical History:  Diagnosis Date  . Cancer ([place])    basal cell carcinoma    There are no active problems to display for this patient.   History reviewed. No pertinent surgical history.  Prior to Admission medications   Medication Sig Start Date End Date Taking? Authorizing Provider  azithromycin (ZITHROMAX Z-PAK) 250 MG tablet Written rx given to pt 09/05/16   Tommie Homero Fellers, NP  norethindrone-ethinyl estradiol (MICROGESTIN,JUNEL,LOESTRIN) 1-20 MG-MCG tablet Take 1 tablet by mouth daily.    Historical Provider, MD    Allergies Tape  History reviewed. No pertinent family history.  Social History Social History  Substance Use Topics  . Smoking status: Current Every Day Smoker  . Smokeless tobacco: Never Used  . 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 Eyes: No visual changes. ENT: No sore throat. Cardiovascular: Positive for chest pain. Respiratory: Denies  shortness of breath. Gastrointestinal: No abdominal pain.  Positive for nausea, no vomiting.  No diarrhea.  No constipation. Genitourinary: Negative for dysuria. Musculoskeletal: Negative for back pain. Skin: Negative for rash. Neurological: Positive for dizziness. Negative for headaches, focal weakness or numbness.  10-point ROS otherwise negative.  ____________________________________________   PHYSICAL EXAM:  VITAL SIGNS: ED Triage Vitals  Enc Vitals Group     BP 01/15/17 2335 123/74     Pulse Rate 01/15/17 2335 84     Resp 01/15/17 2335 16     Temp 01/15/17 2335 98.3 F (36.8 C)     Temp Source 01/15/17 2335 Oral     SpO2 01/15/17 2335 100 %     Weight 01/15/17 2334 175 lb (79.4 kg)     Height 01/15/17 2334 5\' 3"  (1.6 m)     Head Circumference --      Peak Flow --      Pain Score --      Pain Loc --      Pain Edu? --      Excl. in [place]? --     Constitutional: Alert and oriented. Well appearing and in no acute distress. Eyes: Conjunctivae are normal. PERRL. EOMI. Head: Atraumatic. Nose: No congestion/rhinnorhea. Mouth/Throat: Mucous membranes are moist.  Oropharynx non-erythematous. Neck: No stridor.  No carotid bruits. Cardiovascular: Normal rate, regular rhythm. Grossly normal heart sounds.  Good peripheral circulation. Respiratory: Normal respiratory effort.  No retractions. Lungs CTAB. Gastrointestinal: Soft and nontender. No distention. No abdominal bruits. No CVA tenderness. Musculoskeletal: No lower extremity tenderness nor edema.  No joint effusions. Neurologic:  Normal speech and language. No gross focal neurologic deficits are appreciated.  No gait instability. Skin:  Skin is warm, dry and intact. No rash noted. Psychiatric: Mood and affect are normal. Speech and behavior are normal.  ____________________________________________   LABS (all labs ordered are listed, but only abnormal results are displayed)  Labs Reviewed  BASIC METABOLIC PANEL -  Abnormal; Notable for the following:       Result Value   Potassium 3.4 (*)    Glucose, Bld 116 (*)    All other components within normal limits  HEPATIC FUNCTION PANEL - Abnormal; Notable for the following:    Bilirubin, Direct <0.1 (*)    All other components within normal limits  CBC  TROPONIN I  FIBRIN DERIVATIVES D-DIMER ([facility] ONLY)  LIPASE, BLOOD  TROPONIN I   ____________________________________________  EKG  ED ECG REPORT I, Miguelangel Korn J, the attending physician, personally viewed and interpreted this ECG.   Date: 01/16/2017  EKG Time: 2334  Rate: 94  Rhythm: normal EKG, normal sinus rhythm  Axis: None  Intervals:none  ST&T Change: Nonspecific  ____________________________________________  RADIOLOGY  Chest 2 view interpreted per Dr. Toney Reil: No active cardiopulmonary disease.  Ultrasound interpreted per Dr. Francoise Ceo: 1. No sonographic evidence for acute cholecystitis or biliary  dilatation  2. Increased echogenicity consistent with fatty infiltration   ____________________________________________   PROCEDURES  Procedure(s) performed: None  Procedures  Critical Care performed: No  ____________________________________________   INITIAL IMPRESSION / ASSESSMENT AND PLAN / ED COURSE  Pertinent labs & imaging results that were available during my care of the patient were reviewed by me and considered in my medical decision making (see chart for details).  27 year old female who presents with a one-day history of right sided chest pain. Given that she is on OCPs and is also a smoker, will add a d-dimer to basic lab work. Reports pain 3/10 currently; will try Pepcid for relief of symptoms.  Clinical Course as of Jan 16 334  Wed Jan 16, 2017  0111 Patient resting in no acute distress. Updated her of negative first troponin. Will repeat timed troponin. Awaiting results of d-dimer.  [JS]  0330 Updated patient of negative ultrasound and negative repeat  troponin results. Patient reports she has reflux and GI symptoms often. Will place her on Protonix, Carafate and Zofran as needed. Will have her follow-up with both GI as well as cardiology. Strict return precautions given. Patient verbalizes understanding and agrees with plan of care.  [JS]    Clinical Course User Index [JS] Paulette Blanch, MD     ____________________________________________   FINAL CLINICAL IMPRESSION(S) / ED DIAGNOSES  Final diagnoses:  Nonspecific chest pain      NEW MEDICATIONS STARTED DURING THIS VISIT:  New Prescriptions   No medications on file     Note:  This document was prepared using Dragon voice recognition software and may include unintentional dictation errors.    Paulette Blanch, MD 01/16/17 [phone]

## 2017-01-16 ENCOUNTER — Emergency Department: Payer: 59

## 2017-01-16 DIAGNOSIS — R42 Dizziness and giddiness: Secondary | ICD-10-CM | POA: Diagnosis not present

## 2017-01-16 DIAGNOSIS — R1011 Right upper quadrant pain: Secondary | ICD-10-CM | POA: Diagnosis not present

## 2017-01-16 DIAGNOSIS — Z79899 Other long term (current) drug therapy: Secondary | ICD-10-CM | POA: Diagnosis not present

## 2017-01-16 DIAGNOSIS — F17200 Nicotine dependence, unspecified, uncomplicated: Secondary | ICD-10-CM | POA: Diagnosis not present

## 2017-01-16 DIAGNOSIS — R110 Nausea: Secondary | ICD-10-CM | POA: Diagnosis not present

## 2017-01-16 DIAGNOSIS — R079 Chest pain, unspecified: Secondary | ICD-10-CM | POA: Diagnosis not present

## 2017-01-16 LAB — HEPATIC FUNCTION PANEL
ALBUMIN: 4.3 g/dL (ref 3.5–5.0)
ALT: 20 U/L (ref 14–54)
AST: 21 U/L (ref 15–41)
Alkaline Phosphatase: 60 U/L (ref 38–126)
BILIRUBIN TOTAL: 0.3 mg/dL (ref 0.3–1.2)
Bilirubin, Direct: <0.1 mg/dL — ABNORMAL LOW (ref 0.1–0.5)
Total Protein: 7.2 g/dL (ref 6.5–8.1)

## 2017-01-16 LAB — CBC
HEMATOCRIT: 38.7 % (ref 35.0–47.0)
HEMOGLOBIN: 13.8 g/dL (ref 12.0–16.0)
MCH: 32.9 pg (ref 26.0–34.0)
MCHC: 35.6 g/dL (ref 32.0–36.0)
MCV: 92.6 fL (ref 80.0–100.0)
Platelets: 263 10*3/uL (ref 150–440)
RBC: 4.18 MIL/uL (ref 3.80–5.20)
RDW: 12.2 % (ref 11.5–14.5)
WBC: 9.9 10*3/uL (ref 3.6–11.0)

## 2017-01-16 LAB — BASIC METABOLIC PANEL
Anion gap: 7 (ref 5–15)
BUN: 17 mg/dL (ref 6–20)
CHLORIDE: 106 mmol/L (ref 101–111)
CO2: 25 mmol/L (ref 22–32)
Calcium: 9.5 mg/dL (ref 8.9–10.3)
Creatinine, Ser: 0.89 mg/dL (ref 0.44–1.00)
GFR calc non Af Amer: >60 mL/min (ref >60)
GLUCOSE: 116 mg/dL — AB (ref 65–99)
POTASSIUM: 3.4 mmol/L — AB (ref 3.5–5.1)
Sodium: 138 mmol/L (ref 135–145)

## 2017-01-16 LAB — LIPASE, BLOOD: Lipase: 39 U/L (ref 11–51)

## 2017-01-16 LAB — TROPONIN I

## 2017-01-16 LAB — FIBRIN DERIVATIVES D-DIMER (ARMC ONLY): Fibrin derivatives D-dimer (ARMC): 272 (ref 0–499)

## 2017-01-16 MED ORDER — SUCRALFATE 1 GM/10ML PO SUSP: 1.0000 g | Freq: Four times a day (QID) | ORAL | 1 refills | Status: DC

## 2017-01-16 MED ORDER — ONDANSETRON HCL 4 MG/2ML IJ SOLN
4.0000 mg | Freq: Once | INTRAMUSCULAR | Status: AC
  Administered 2017-01-16: 4 mg via INTRAVENOUS
  Filled 2017-01-16: qty 2

## 2017-01-16 MED ORDER — PANTOPRAZOLE SODIUM 40 MG PO TBEC: 40.0000 mg | DELAYED_RELEASE_TABLET | Freq: Every day | ORAL | 1 refills | Status: DC

## 2017-01-16 MED ORDER — ONDANSETRON 4 MG PO TBDP: 4.0000 mg | ORAL_TABLET | Freq: Three times a day (TID) | ORAL | 1 refills | Status: DC | PRN

## 2017-01-16 MED ORDER — FAMOTIDINE IN NACL 20-0.9 MG/50ML-% IV SOLN
20.0000 mg | Freq: Once | INTRAVENOUS | Status: AC
  Administered 2017-01-16: 20 mg via INTRAVENOUS
  Filled 2017-01-16: qty 50

## 2017-01-16 NOTE — ED Notes (Signed)
Pt discharged to home.  Discharge instructions reviewed.  Verbalized understanding.  No questions or concerns at this time.  Teach back verified.  Pt in NAD.  No items left in ED.   

## 2017-01-16 NOTE — Discharge Instructions (Signed)
1. Start Protonix 40 mg daily. 2. Take Carafate 4 times daily (with meals and at bedtime). 3. You may take Zofran as needed for nausea. 4. Return to the ER for worsening symptoms, persistent vomiting, difficulty breathing or other concerns.

## 2017-01-16 NOTE — ED Notes (Signed)
Spoke with Baxter Flattery in lab regarding patient's d-dimer.  Per Baxter Flattery, test was not ran at this time and would be another 15 minutes.

## 2017-01-23 ENCOUNTER — Encounter: Payer: Self-pay | Admitting: Obstetrics and Gynecology

## 2017-01-23 ENCOUNTER — Ambulatory Visit (INDEPENDENT_AMBULATORY_CARE_PROVIDER_SITE_OTHER): Payer: 59 | Admitting: Obstetrics and Gynecology

## 2017-01-23 VITALS — BP 155/85 | HR 80 | Ht 64.0 in | Wt 182.4 lb

## 2017-01-23 DIAGNOSIS — Z01419 Encounter for gynecological examination (general) (routine) without abnormal findings: Secondary | ICD-10-CM | POA: Diagnosis not present

## 2017-01-23 MED ORDER — NORETHIN ACE-ETH ESTRAD-FE 1-20 MG-MCG(24) PO TABS: 1.0000 | ORAL_TABLET | Freq: Every day | ORAL | 3 refills | Status: DC

## 2017-01-23 NOTE — Progress Notes (Signed)
HPI:      [name] is a 27 y.o. G0P0000 who LMP was Patient's last menstrual period was 01/16/2017..  Subjective: She presents today for her annual examination.  She is taking OCPs and would like to continue. She takes them in a continuous fashion and only has a few periods per year. She would like to continue this as well. She has no complaints today.    Hx: The following portions of the patient's history were reviewed and updated as appropriate:           She  has a past medical history of Cancer ([place]). She  does not have a problem list on file. She  has a past surgical history that includes Lipoma excision (Left, 2017). Her family history includes Cancer in her maternal grandfather; Seizures in her father. She  reports that she has been smoking.  She has never used smokeless tobacco. She reports that she drinks alcohol. She reports that she does not use drugs.        ROS: Constitutional: Denied constitutional symptoms, night sweats, recent illness, fatigue, fever, insomnia and weight loss.  Eyes: Denied eye symptoms, eye pain, photophobia, vision change and visual disturbance.  Ears/Nose/Throat/Neck: Denied ear, nose, throat or neck symptoms, hearing loss, nasal discharge, sinus congestion and sore throat.  Cardiovascular: Denied cardiovascular symptoms, arrhythmia, chest pain/pressure, edema, exercise intolerance, orthopnea and palpitations.  Respiratory: Denied pulmonary symptoms, asthma, pleuritic pain, productive sputum, cough, dyspnea and wheezing.  Gastrointestinal: Denied, gastro-esophageal reflux, melena, nausea and vomiting.  Genitourinary: Denied genitourinary symptoms including symptomatic vaginal discharge, pelvic relaxation issues, and urinary complaints.  Musculoskeletal: Denied musculoskeletal symptoms, stiffness, swelling, muscle weakness and myalgia.  Dermatologic: Denied dermatology symptoms, rash and scar.  Neurologic: Denied neurology symptoms,  dizziness, headache, neck pain and syncope.  Psychiatric: Denied psychiatric symptoms, anxiety and depression.  Endocrine: Denied endocrine symptoms including hot flashes and night sweats.   Meds: She has a current medication list which includes the following prescription(s): norethindrone-ethinyl estradiol and norethindrone acetate-ethinyl estrad-fe.  Objective: Vitals:   01/23/17 0959  BP: (!) 155/85  Pulse: 80             Physical examination General NAD, Conversant  HEENT Atraumatic; Op clear with mmm.  Normo-cephalic. Pupils reactive. Anicteric sclerae  Thyroid/Neck Smooth without nodularity or enlargement. Normal ROM.  Neck Supple.  Skin No rashes, lesions or ulceration. Normal palpated skin turgor. No nodularity.  Breasts: No masses or discharge.  Symmetric.  No axillary adenopathy.  Lungs: Clear to auscultation.No rales or wheezes. Normal Respiratory effort, no retractions.  Heart: NSR.  No murmurs or rubs appreciated. No periferal edema  Abdomen: Soft.  Non-tender.  No masses.  No HSM. No hernia  Extremities: Moves all appropriately.  Normal ROM for age. No lymphadenopathy.  Neuro: Oriented to PPT.  Normal mood. Normal affect.     Pelvic:   Vulva: Normal appearance.  No lesions.  Vagina: No lesions or abnormalities noted.  Support: Normal pelvic support.  Urethra No masses tenderness or scarring.  Meatus Normal size without lesions or prolapse.  Vag Cuff: Intact.  No lesions.  Anus: Normal exam.  No lesions.  Perineum: Normal exam.  No lesions.        Bimanual   Adnexae: No masses.  Non-tender to palpation.  Cuff: Negative for abnormality.     Assessment: 1. Encounter for well woman exam with routine gynecological exam   2.     OCPs without problem-patient would like to  continue.   Plan:            1.  Pap performed 2.  Continue OCPs Basic Screening Recommendations The basic screening recommendations for asymptomatic women were discussed with the patient  during her visit.  The age-appropriate recommendations were discussed with her and the rational for the tests reviewed.  When I am informed by the patient that another primary care physician has previously obtained the age-appropriate tests and they are up-to-date, only outstanding tests are ordered and referrals given as necessary.  Abnormal results of tests will be discussed with her when all of her results are completed.      Orders Meds ordered this encounter  Medications  . Norethindrone Acetate-Ethinyl Estrad-FE (LOESTRIN 24 FE) 1-20 MG-MCG(24) tablet    Sig: Take 1 tablet by mouth at bedtime.    Dispense:  1 Package    Refill:  3           F/U  Return in about 1 year (around 01/23/2018).  Finis Bud, M.D. 01/23/2017 10:54 AM

## 2017-01-23 NOTE — Progress Notes (Signed)
Pt is a 27 yo here for a pap smear. LPS 11/2016 WNL. Pt has a history of fibrocystic right breast.

## 2017-01-28 LAB — PAP IG, CT-NG, RFX HPV ASCU
CHLAMYDIA, NUC. ACID AMP: NEGATIVE
Gonococcus by Nucleic Acid Amp: NEGATIVE
PAP Smear Comment: 0

## 2017-01-29 ENCOUNTER — Telehealth: Payer: Self-pay

## 2017-01-29 NOTE — Telephone Encounter (Signed)
-----   Message from Harlin Heys, MD sent at 01/29/2017  3:58 PM EST ----- Pap Normal - Follow-up in one year for AE

## 2017-01-29 NOTE — Telephone Encounter (Signed)
Spoke with pt results given with understanding.

## 2017-01-29 NOTE — Telephone Encounter (Signed)
Message left on pts answering machine to please return call re: normal pap smear results. RTO in 1 year for annual exam.

## 2017-02-07 ENCOUNTER — Encounter: Payer: Self-pay | Admitting: Obstetrics and Gynecology

## 2017-03-14 ENCOUNTER — Emergency Department
Admission: EM | Admit: 2017-03-14 | Discharge: 2017-03-14 | Disposition: A | Discharge status: Home / Self Care | Payer: 59 | Attending: Emergency Medicine | Admitting: Emergency Medicine

## 2017-03-14 DIAGNOSIS — F17200 Nicotine dependence, unspecified, uncomplicated: Secondary | ICD-10-CM | POA: Insufficient documentation

## 2017-03-14 DIAGNOSIS — M436 Torticollis: Secondary | ICD-10-CM | POA: Insufficient documentation

## 2017-03-14 DIAGNOSIS — M542 Cervicalgia: Secondary | ICD-10-CM | POA: Diagnosis present

## 2017-03-14 MED ORDER — DIAZEPAM 2 MG PO TABS: 2.0000 mg | ORAL_TABLET | Freq: Three times a day (TID) | ORAL | 0 refills | Status: DC | PRN

## 2017-03-14 MED ORDER — KETOROLAC TROMETHAMINE 60 MG/2ML IM SOLN
30.0000 mg | Freq: Once | INTRAMUSCULAR | Status: AC
  Administered 2017-03-14: 30 mg via INTRAMUSCULAR
  Filled 2017-03-14: qty 2

## 2017-03-14 MED ORDER — DIAZEPAM 5 MG PO TABS
5.0000 mg | ORAL_TABLET | Freq: Once | ORAL | Status: AC
  Administered 2017-03-14: 5 mg via ORAL
  Filled 2017-03-14: qty 1

## 2017-03-14 MED ORDER — ORPHENADRINE CITRATE 30 MG/ML IJ SOLN
60.0000 mg | INTRAMUSCULAR | Status: AC
  Administered 2017-03-14: 60 mg via INTRAMUSCULAR
  Filled 2017-03-14: qty 2

## 2017-03-14 NOTE — ED Provider Notes (Signed)
[facility] Emergency Department Provider Note ____________________________________________  Time seen: 1313  I have reviewed the triage vital signs and the nursing notes.  HISTORY  Chief Complaint  Neck Pain  HPI Mandy Hughes is a 27 y.o. female presents to the ED for evaluation of acute left -sided neck pain and severe muscle spasms. She reports onset upon awakening. She admits to carrying a heavy gas grill up several steps yesterday, as the only probable offending factor. She denies distal paresthesias, grip change, or chest pain. She admits to increased pain and spasms with attempts to look left or set the head upright.   Past Medical History:  Diagnosis Date  . Cancer ([place])    basal cell carcinoma    There are no active problems to display for this patient.   Past Surgical History:  Procedure Laterality Date  . LIPOMA EXCISION Left 2017   shoulder    Prior to Admission medications   Medication Sig Start Date End Date Taking? Authorizing Provider  diazepam (VALIUM) 2 MG tablet Take 1 tablet (2 mg total) by mouth every 8 (eight) hours as needed for muscle spasms. 03/14/17   Ethelyn Cerniglia V Bacon Abdur Hoglund, PA-C  Norethindrone Acetate-Ethinyl Estrad-FE (LOESTRIN 24 FE) 1-20 MG-MCG(24) tablet Take 1 tablet by mouth at bedtime. 01/23/17 02/20/17  Harlin Heys, MD  norethindrone-ethinyl estradiol (MICROGESTIN,JUNEL,LOESTRIN) 1-20 MG-MCG tablet Take 1 tablet by mouth daily.    Historical Provider, MD    Allergies Tape  Family History  Problem Relation Age of Onset  . Seizures Father   . Cancer Maternal Grandfather     Social History Social History  Substance Use Topics  . Smoking status: Current Every Day Smoker  . Smokeless tobacco: Never Used  . Alcohol use Yes     Comment: rarely    Review of Systems  Constitutional: Negative for fever. Cardiovascular: Negative for chest pain. Respiratory: Negative for shortness of  breath. Musculoskeletal: Negative for back pain. Acute left neck pain and spasms. Skin: Negative for rash. Neurological: Negative for headaches, focal weakness or numbness. ____________________________________________  PHYSICAL EXAM:  VITAL SIGNS: ED Triage Vitals [03/14/17 1305]  Enc Vitals Group     BP 139/70     Pulse Rate 92     Resp 16     Temp 98.6 F (37 C)     Temp Source Oral     SpO2 100 %     Weight 175 lb (79.4 kg)     Height 5\' 3"  (1.6 m)     Head Circumference      Peak Flow      Pain Score 8     Pain Loc      Pain Edu?      Excl. in [place]?     Constitutional: Alert and oriented. Well appearing and in no distress. Head: Normocephalic and atraumatic. Eyes: Conjunctivae are normal. PERRL. Normal extraocular movements Neck: Supple. No thyromegaly. No palpable spasms or rigidity. Patient with neck flexed to the right in a fixed position.  Cardiovascular: Normal rate, regular rhythm. Normal distal pulses. Respiratory: Normal respiratory effort. No wheezes/rales/rhonchi. Musculoskeletal: Normal spinal alignment without midline spasm, deformity, step-off, or dislocation. Nontender with normal range of motion in all extremities.  Neurologic:  CNII-XII grossly intact. Normal UE DTRs bilaterally. Normal gait without ataxia. Normal speech and language. No gross focal neurologic deficits are appreciated. Skin:  Skin is warm, dry and intact. No rash noted. ____________________________________________  PROCEDURES  Toradol 30 mg IM Norflex  60 mg IM Valium 5 mg PO ____________________________________________  INITIAL IMPRESSION / ASSESSMENT AND PLAN / ED COURSE  Patient with acute torticollis on presentation. She exhibits some improvement to ROM following ED med administration. Her exam is without any acute neuromuscular deficits. She is discharged with a prescription for Valium for muscle spasm relief. Return to the ED as needed, or see the PCP in the interim. Work  activities as tolerated.  ____________________________________________  FINAL CLINICAL IMPRESSION(S) / ED DIAGNOSES  Final diagnoses:  Torticollis, acute      Melvenia Needles, PA-C 03/14/17 Aguilar, MD 03/18/17 2227

## 2017-03-14 NOTE — Discharge Instructions (Signed)
Your exam is consistent with acute cervical muscle spasms. Take the prescription muscle relaxant along with the previously prescribed anti-inflammatory. Apply ice or moist heat to reduce symptoms. Follow-up with your provider or return as needed.

## 2017-03-14 NOTE — ED Notes (Signed)
States no relief from pain

## 2017-03-14 NOTE — ED Triage Notes (Signed)
Patient presents to the ED with very stiff neck and neck pain.  Patient reports waking up this morning with severe neck pain and stiffness.  Denies headache and fever.  Patient's head is tilted to her right.  Patient appears very uncomfortable.

## 2017-04-23 ENCOUNTER — Other Ambulatory Visit: Payer: Self-pay

## 2017-04-23 DIAGNOSIS — Z01419 Encounter for gynecological examination (general) (routine) without abnormal findings: Secondary | ICD-10-CM

## 2017-04-23 MED ORDER — NORETHIN ACE-ETH ESTRAD-FE 1-20 MG-MCG(24) PO TABS: 1.0000 | ORAL_TABLET | Freq: Every day | ORAL | 6 refills | Status: DC

## 2017-05-16 ENCOUNTER — Ambulatory Visit: Payer: Self-pay | Admitting: Physician Assistant

## 2017-05-16 ENCOUNTER — Encounter: Payer: Self-pay | Admitting: Physician Assistant

## 2017-05-16 VITALS — BP 130/90 | HR 93 | Temp 98.7°F

## 2017-05-16 DIAGNOSIS — J069 Acute upper respiratory infection, unspecified: Secondary | ICD-10-CM

## 2017-05-16 MED ORDER — AZITHROMYCIN 250 MG PO TABS: ORAL_TABLET | ORAL | 0 refills | Status: DC

## 2017-05-16 NOTE — Progress Notes (Signed)
S: C/o runny nose and congestion for 5 days, no fever, chills, cp/sob, v/d; mucus was green this am but clear throughout the day, cough is sporadic, deep, feels like it has gone from her head to her chest  Using otc meds: zyrtec, flonase  O: PE: vitals wnl, nad, perrl eomi, normocephalic, tms dull, nasal mucosa red and swollen, throat injected, neck supple no lymph, lungs c t a, cv rrr, neuro intact  A:  Acute  uri   P: drink fluids, continue regular meds , use otc meds of choice, return if not improving in 5 days, return earlier if worsening , zpack

## 2017-06-27 ENCOUNTER — Encounter: Payer: Self-pay | Admitting: Physician Assistant

## 2017-06-27 ENCOUNTER — Ambulatory Visit: Payer: Self-pay | Admitting: Physician Assistant

## 2017-06-27 VITALS — BP 112/80 | HR 98 | Temp 98.4°F

## 2017-06-27 DIAGNOSIS — R300 Dysuria: Secondary | ICD-10-CM

## 2017-06-27 DIAGNOSIS — N390 Urinary tract infection, site not specified: Secondary | ICD-10-CM

## 2017-06-27 DIAGNOSIS — R319 Hematuria, unspecified: Secondary | ICD-10-CM

## 2017-06-27 LAB — POCT URINALYSIS DIPSTICK
Bilirubin, UA: NEGATIVE
Glucose, UA: NEGATIVE
Ketones, UA: NEGATIVE
Nitrite, UA: POSITIVE
Spec Grav, UA: >=1.03 — AB (ref 1.010–1.025)
UROBILINOGEN UA: 0.2 U/dL
pH, UA: 6 (ref 5.0–8.0)

## 2017-06-27 MED ORDER — NITROFURANTOIN MONOHYD MACRO 100 MG PO CAPS: 100.0000 mg | ORAL_CAPSULE | Freq: Two times a day (BID) | ORAL | 0 refills | Status: DC

## 2017-06-27 MED ORDER — FLUCONAZOLE 150 MG PO TABS: 150.0000 mg | ORAL_TABLET | Freq: Once | ORAL | 0 refills | Status: AC

## 2017-06-27 NOTE — Progress Notes (Signed)
S:  C/o uti sx for 2 days, burning, urgency, frequency, denies vaginal discharge, abdominal pain or flank pain:  Remainder ros neg  O:  Vitals wnl, nad, neuro intact, ua 1+ leuks, nitrites +, blood 2+  A: uti  P: macrobid 100mg  bid x 7d, increase water intake, add cranberry juice, return if not improving in 2 -3 days, return earlier if worsening, discussed pyelonephritis sx

## 2017-07-05 ENCOUNTER — Ambulatory Visit: Payer: Self-pay | Admitting: Family

## 2017-07-05 ENCOUNTER — Other Ambulatory Visit
Admission: RE | Admit: 2017-07-05 | Discharge: 2017-07-05 | Disposition: A | Discharge status: Home / Self Care | Payer: 59 | Source: Ambulatory Visit | Attending: Pediatrics | Admitting: Pediatrics

## 2017-07-05 VITALS — BP 120/80 | HR 85 | Temp 98.8°F

## 2017-07-05 DIAGNOSIS — R300 Dysuria: Secondary | ICD-10-CM | POA: Diagnosis not present

## 2017-07-05 LAB — POCT URINALYSIS DIPSTICK
Bilirubin, UA: NEGATIVE
Glucose, UA: NEGATIVE
KETONES UA: NEGATIVE
LEUKOCYTES UA: NEGATIVE
NITRITE UA: NEGATIVE
PH UA: 5.5 (ref 5.0–8.0)
PROTEIN UA: NEGATIVE
Spec Grav, UA: 1.025 (ref 1.010–1.025)
Urobilinogen, UA: 0.2 E.U./dL

## 2017-07-05 MED ORDER — SULFAMETHOXAZOLE-TRIMETHOPRIM 800-160 MG PO TABS: 1.0000 | ORAL_TABLET | Freq: Two times a day (BID) | ORAL | 0 refills | Status: DC

## 2017-07-05 MED ORDER — PHENAZOPYRIDINE HCL 200 MG PO TABS: 200.0000 mg | ORAL_TABLET | Freq: Three times a day (TID) | ORAL | 0 refills | Status: DC | PRN

## 2017-07-05 NOTE — Progress Notes (Signed)
Subjective/Mandy Hughes returns with persistent dysuria. She took 5 days of the Macrobid prescribed recently without improvement, in fact became febrile to 100.6 on the  5 th day. Her ED physicians sent  Cipro for her and she has taken 4 days of it without improvement. She has had only one UTI in her life in the remote past. She denies contributing factors of diarrhea, constipation and she urinates before and after intercourse. She denies vaginal discharge, itching or risk for STDs. She is drinking more water at work. She denies severe abdominal pain or vomiting. Objective vital signs stable alert pleasant no acute distress Abd : soft, no quarding   U/A dip  shows only 1+ blood otherwise ne,gative A/: Dysuria  P/: Urine sent for culture. Rx given for Septra DS 1 by mouth twice a day #14 to take until culture results are back. Hydration is encouraged. Rx sent for peridium. If symptoms worsen she is to seek urgent care

## 2017-07-06 LAB — URINE CULTURE: Culture: NO GROWTH

## 2017-08-05 ENCOUNTER — Ambulatory Visit: Payer: Self-pay | Admitting: Physician Assistant

## 2017-08-05 ENCOUNTER — Encounter: Payer: Self-pay | Admitting: Physician Assistant

## 2017-08-05 VITALS — BP 128/82 | HR 99 | Temp 98.5°F

## 2017-08-05 DIAGNOSIS — N390 Urinary tract infection, site not specified: Secondary | ICD-10-CM

## 2017-08-05 DIAGNOSIS — R300 Dysuria: Secondary | ICD-10-CM

## 2017-08-05 DIAGNOSIS — R319 Hematuria, unspecified: Secondary | ICD-10-CM

## 2017-08-05 DIAGNOSIS — N912 Amenorrhea, unspecified: Secondary | ICD-10-CM

## 2017-08-05 LAB — POCT URINALYSIS DIPSTICK
Bilirubin, UA: NEGATIVE
Glucose, UA: NEGATIVE
Nitrite, UA: POSITIVE
Protein, UA: NEGATIVE
Spec Grav, UA: 1.015
Urobilinogen, UA: 0.2 U/dL
pH, UA: 5.5

## 2017-08-05 LAB — POCT URINE PREGNANCY: Preg Test, Ur: NEGATIVE

## 2017-08-05 MED ORDER — SULFAMETHOXAZOLE-TRIMETHOPRIM 800-160 MG PO TABS: 1.0000 | ORAL_TABLET | Freq: Two times a day (BID) | ORAL | 0 refills | Status: DC

## 2017-08-05 NOTE — Progress Notes (Signed)
S:  C/o uti sx for 2 days, burning, urgency, frequency, denies vaginal discharge, abdominal pain or flank pain:  Remainder ros neg  O:  Vitals wnl, nad, ua with 1+ leuks, +nitrites, urine preg neg  A: uti  P: septra ds bid x 7d, increase water intake, add cranberry juice, return if not improving in 2 -3 days, return earlier if worsening, discussed pyelonephritis sx, f/u with gyn as this is the 3rd uti in 3 months

## 2017-08-14 ENCOUNTER — Ambulatory Visit (INDEPENDENT_AMBULATORY_CARE_PROVIDER_SITE_OTHER): Payer: 59 | Admitting: Obstetrics and Gynecology

## 2017-08-14 ENCOUNTER — Encounter: Payer: Self-pay | Admitting: Obstetrics and Gynecology

## 2017-08-14 VITALS — BP 132/76 | HR 101 | Ht 63.0 in | Wt 177.0 lb

## 2017-08-14 DIAGNOSIS — N390 Urinary tract infection, site not specified: Secondary | ICD-10-CM

## 2017-08-14 DIAGNOSIS — N921 Excessive and frequent menstruation with irregular cycle: Secondary | ICD-10-CM

## 2017-08-14 DIAGNOSIS — Z711 Person with feared health complaint in whom no diagnosis is made: Secondary | ICD-10-CM | POA: Diagnosis not present

## 2017-08-14 MED ORDER — LEVONORGEST-ETH ESTRAD 91-DAY 0.15-0.03 &0.01 MG PO TABS: 1.0000 | ORAL_TABLET | Freq: Every day | ORAL | 1 refills | Status: DC

## 2017-08-14 NOTE — Progress Notes (Signed)
HPI:      Mandy Hughes is a 27 y.o. G0P0000 who LMP was No LMP recorded. Patient is not currently having periods (Reason: Oral contraceptives).  Subjective:   She presents today After being referred here by her family physician. She has had 2 urinary tract infections in the last 2 months. The first occurred in June and resolved with antibiotics as proven by cultures. The second occurred last week patient has taken antibiotics and is now asymptomatic. She is interested in STD testing as suggested by her family physician. She cannot absolutely time the occurrence of UTI with intercourse (honeymoon cystitis) She has no vaginal symptoms including odor or discharge. She has been taking OCPs in a continuous manner but has noticed some increased breakthrough bleeding recently. Considering a pill change.    Hx: The following portions of the patient's history were reviewed and updated as appropriate:             She  has a past medical history of Cancer ([place]). She  does not have a problem list on file. She  has a past surgical history that includes Lipoma excision (Left, 2017). Her family history includes Cancer in her maternal grandfather; Seizures in her father. She  reports that she has been smoking.  She has never used smokeless tobacco. She reports that she drinks alcohol. She reports that she does not use drugs. She is allergic to tape.       Review of Systems:  Review of Systems  Constitutional: Denied constitutional symptoms, night sweats, recent illness, fatigue, fever, insomnia and weight loss.  Eyes: Denied eye symptoms, eye pain, photophobia, vision change and visual disturbance.  Ears/Nose/Throat/Neck: Denied ear, nose, throat or neck symptoms, hearing loss, nasal discharge, sinus congestion and sore throat.  Cardiovascular: Denied cardiovascular symptoms, arrhythmia, chest pain/pressure, edema, exercise intolerance, orthopnea and palpitations.  Respiratory: Denied pulmonary  symptoms, asthma, pleuritic pain, productive sputum, cough, dyspnea and wheezing.  Gastrointestinal: Denied, gastro-esophageal reflux, melena, nausea and vomiting.  Genitourinary: See HPI for additional information.  Musculoskeletal: Denied musculoskeletal symptoms, stiffness, swelling, muscle weakness and myalgia.  Dermatologic: Denied dermatology symptoms, rash and scar.  Neurologic: Denied neurology symptoms, dizziness, headache, neck pain and syncope.  Psychiatric: Denied psychiatric symptoms, anxiety and depression.  Endocrine: Denied endocrine symptoms including hot flashes and night sweats.   Meds:   Current Outpatient Prescriptions on File Prior to Visit  Medication Sig Dispense Refill  . ciprofloxacin (CIPRO) 500 MG tablet   0  . nitrofurantoin, macrocrystal-monohydrate, (MACROBID) 100 MG capsule Take 1 capsule (100 mg total) by mouth 2 (two) times daily. (Patient not taking: Reported on 07/05/2017) 14 capsule 0  . norethindrone-ethinyl estradiol (MICROGESTIN,JUNEL,LOESTRIN) 1-20 MG-MCG tablet Take 1 tablet by mouth daily.    . phenazopyridine (PYRIDIUM) 200 MG tablet Take 1 tablet (200 mg total) by mouth 3 (three) times daily as needed for pain. (Patient not taking: Reported on 08/14/2017) 10 tablet 0  . sulfamethoxazole-trimethoprim (BACTRIM DS,SEPTRA DS) 800-160 MG tablet Take 1 tablet by mouth 2 (two) times daily. (Patient not taking: Reported on 08/14/2017) 14 tablet 0   No current facility-administered medications on file prior to visit.     Objective:     Vitals:   08/14/17 0930  BP: 132/76  Pulse: (!) 101              Physical examination   Pelvic:   Vulva: Normal appearance.  No lesions.  Vagina: No lesions or abnormalities noted.  Small amount of  dark blood noted   Support: Normal pelvic support.  Urethra No masses tenderness or scarring.  No evidence of divertic  Meatus Normal size without lesions or prolapse.  Cervix: Normal appearance.  No lesions.  Anus:  Normal exam.  No lesions.  Perineum: Normal exam.  No lesions.        Bimanual   Uterus: Normal size.  Non-tender.  Mobile.  AV.  Adnexae: No masses.  Non-tender to palpation.  Cul-de-sac: Negative for abnormality.   WET PREP: clue cells: absent, KOH (yeast): negative, odor: absent and trichomoniasis: negative Ph:  < 4.5   Assessment:    G0P0000 There are no active problems to display for this patient.    1. Frequent UTI   2. Concern about STD in female without diagnosis   3. Breakthrough bleeding on OCPs     No particular physical reason for 2 UTIs in the last 2 months noted. Possibly related to intercourse but not definitive. Patient is already taking steps to prevent postcoital UTIs. Doubt STD as a cause of UTIs - wet prep negative.   Breakthrough bleeding on OCPs - patient desires change.   Plan:            1.  Reinforced steps to prevent UTIs. Strongly recommend culture with any future positive UA to identify bacteria involved and susceptibility.  2.  GC/CT performed  3.  Will change pills to [facility] after completion of current pack-discussed in detail with patient.  Orders Orders Placed This Encounter  Procedures  . GC/Chlamydia Probe Amp     Meds ordered this encounter  Medications  . BLISOVI 24 FE 1-20 MG-MCG(24) tablet  . Levonorgestrel-Ethinyl Estradiol (AMETHIA,CAMRESE) 0.15-0.03 &0.01 MG tablet    Sig: Take 1 tablet by mouth at bedtime.    Dispense:  84 tablet    Refill:  1        F/U  No Follow-up on file. I spent 31 minutes with this patient of which greater than 50% was spent discussing recurrent UTIs, STDs, breakthrough bleeding on OCPs.  Finis Bud, M.D. 08/14/2017 10:45 AM

## 2017-08-16 LAB — GC/CHLAMYDIA PROBE AMP
CHLAMYDIA, DNA PROBE: NEGATIVE
NEISSERIA GONORRHOEAE BY PCR: NEGATIVE

## 2017-10-01 ENCOUNTER — Other Ambulatory Visit
Admission: RE | Admit: 2017-10-01 | Discharge: 2017-10-01 | Disposition: A | Discharge status: Home / Self Care | Payer: BC Managed Care – PPO | Source: Ambulatory Visit | Attending: Family | Admitting: Family

## 2017-10-01 ENCOUNTER — Ambulatory Visit: Payer: Self-pay | Admitting: Family

## 2017-10-01 VITALS — BP 124/82 | HR 88 | Temp 98.2°F

## 2017-10-01 DIAGNOSIS — R300 Dysuria: Secondary | ICD-10-CM | POA: Diagnosis present

## 2017-10-01 LAB — POCT URINALYSIS DIPSTICK
Bilirubin, UA: NEGATIVE
Glucose, UA: NEGATIVE
KETONES UA: NEGATIVE
Nitrite, UA: NEGATIVE
PH UA: 6.5 (ref 5.0–8.0)
PROTEIN UA: NEGATIVE
Spec Grav, UA: 1.02 (ref 1.010–1.025)
UROBILINOGEN UA: 0.2 U/dL

## 2017-10-01 MED ORDER — SULFAMETHOXAZOLE-TRIMETHOPRIM 800-160 MG PO TABS: 1.0000 | ORAL_TABLET | Freq: Two times a day (BID) | ORAL | 0 refills | Status: DC

## 2017-10-01 NOTE — Progress Notes (Signed)
S/: Onset of dysuria within 24 hours after intercourse 3 days ago, frequency ,urgency and lower abdominal pressure. She has had 4 UTIs within the past year. She is on birth control pills and husband also uses condoms. She was evaluated by her GYN for STDs, and exam for urethral diverticula all normal. She feels that she drinks adequate water. O/: Vital signs stable alert pleasant nontoxic, abdomen suprapubic tenderness minimal negative CVA UA with blood and leukocytes  A/recurrent UTI  P/: Send urine for culture and refer to urology. Rx Septra DS 1 by mouth twice a day #14.

## 2017-10-02 LAB — URINE CULTURE: CULTURE: NO GROWTH

## 2017-10-02 NOTE — Addendum Note (Signed)
Addended by: Vassie Loll D on: 10/02/2017 09:53 AM   Modules accepted: Orders

## 2017-10-02 NOTE — Progress Notes (Signed)
Spoke with [facility] instructed me to send referral through [place]. Will await appointment

## 2017-10-08 ENCOUNTER — Encounter: Payer: Self-pay | Admitting: Obstetrics and Gynecology

## 2017-10-08 ENCOUNTER — Encounter: Payer: Self-pay | Admitting: Physician Assistant

## 2017-10-08 NOTE — Telephone Encounter (Signed)
See message attached

## 2017-10-21 ENCOUNTER — Ambulatory Visit: Payer: Self-pay | Admitting: Urology

## 2017-10-22 ENCOUNTER — Ambulatory Visit (INDEPENDENT_AMBULATORY_CARE_PROVIDER_SITE_OTHER): Payer: BC Managed Care – PPO | Admitting: Urology

## 2017-10-22 ENCOUNTER — Encounter: Payer: Self-pay | Admitting: Urology

## 2017-10-22 VITALS — BP 127/80 | HR 94 | Ht 63.0 in | Wt 176.2 lb

## 2017-10-22 DIAGNOSIS — R300 Dysuria: Secondary | ICD-10-CM

## 2017-10-22 LAB — URINALYSIS, COMPLETE
Bilirubin, UA: NEGATIVE
GLUCOSE, UA: NEGATIVE
LEUKOCYTES UA: NEGATIVE
Nitrite, UA: NEGATIVE
Protein, UA: NEGATIVE
SPEC GRAV UA: 1.02 (ref 1.005–1.030)
Urobilinogen, Ur: 0.2 mg/dL (ref 0.2–1.0)
pH, UA: 7 (ref 5.0–7.5)

## 2017-10-22 LAB — MICROSCOPIC EXAMINATION
EPITHELIAL CELLS (NON RENAL): NONE SEEN /HPF (ref 0–10)
RBC, UA: NONE SEEN /hpf (ref 0–?)
WBC, UA: NONE SEEN /hpf (ref 0–?)

## 2017-10-22 MED ORDER — SULFAMETHOXAZOLE-TRIMETHOPRIM 400-80 MG PO TABS: ORAL_TABLET | ORAL | 0 refills | Status: DC

## 2017-10-22 NOTE — Progress Notes (Signed)
10/22/2017 2:59 PM   Lassie Genia Del [birth year] [id number]  Referring provider: N/A  No chief complaint on file. Chief complaint: 3 UTIs in 4 months  HPI: Mandy Hughes is a 27 year-old female who has been treated for 3 urinary tract infections in the past 4 months.  Symptom onset consisted of urinary frequency, urgency, dysuria and suprapubic pressure.  Urinalysis has shown mild pyuria however urine cultures have been negative.  She had been on antibiotics at the time of her second urine culture.  She states symptom onset has been directly related to within 24 hours after intercourse.  With her initial infection she was started on nitrofurantoin and was switched to Cipro when she was having persistent symptoms.  She developed a fever to 100.6 degrees and was subsequently switched to Septra with resolution of her symptoms.  Her last symptoms have been treated with Cipro with symptom resolution.  She denies previous history of urologic problems.  She has no complaints today.  PMH: Past Medical History:  Diagnosis Date  . Cancer ([place])    basal cell carcinoma    Surgical History: Past Surgical History:  Procedure Laterality Date  . LIPOMA EXCISION Left 2017   shoulder  . SKIN CANCER EXCISION      Home Medications:  Allergies as of 10/22/2017      Reactions   Tape Rash      Medication List       Accurate as of 10/22/17  2:59 PM. Always use your most recent med list.          BLISOVI 24 FE 1-20 MG-MCG(24) tablet Generic drug:  Norethindrone Acetate-Ethinyl Estrad-FE       Allergies:  Allergies  Allergen Reactions  . Tape Rash    Family History: Family History  Problem Relation Age of Onset  . Seizures Father   . Cancer Maternal Grandfather     Social History:  reports that she has been smoking.  She has never used smokeless tobacco. She reports that she drinks alcohol. She reports that she does not use drugs.  ROS: UROLOGY Frequent Urination?:  No Hard to postpone urination?: No Burning/pain with urination?: No Get up at night to urinate?: No Leakage of urine?: No Urine stream starts and stops?: No Trouble starting stream?: No Do you have to strain to urinate?: No Blood in urine?: No Urinary tract infection?: No Sexually transmitted disease?: No Injury to kidneys or bladder?: No Painful intercourse?: No Weak stream?: No Currently pregnant?: No Vaginal bleeding?: No Last menstrual period?: n  Gastrointestinal Nausea?: Yes Vomiting?: No Indigestion/heartburn?: Yes Diarrhea?: No Constipation?: No  Constitutional Fever: No Night sweats?: No Weight loss?: No Fatigue?: No  Skin Skin rash/lesions?: No Itching?: No  Eyes Blurred vision?: No Double vision?: No  Ears/Nose/Throat Sore throat?: No Sinus problems?: No  Hematologic/Lymphatic Swollen glands?: No Easy bruising?: No  Cardiovascular Leg swelling?: No Chest pain?: No  Respiratory Cough?: No Shortness of breath?: No  Endocrine Excessive thirst?: No  Musculoskeletal Back pain?: No Joint pain?: No  Neurological Headaches?: No Dizziness?: No  Psychologic Depression?: No Anxiety?: No  Physical Exam: BP 127/80 (BP Location: Right Arm, Patient Position: Sitting, Cuff Size: Large)   Pulse 94   Ht 5\' 3"  (1.6 m)   Wt 176 lb 3.2 oz (79.9 kg)   BMI 31.21 kg/m   Constitutional:  Alert and oriented, No acute distress. HEENT: [place] AT, moist mucus membranes.  Trachea midline, no masses. Cardiovascular: No clubbing, cyanosis, or edema. Respiratory:  Normal respiratory effort, no increased work of breathing. GI: Abdomen is soft, nontender, nondistended, no abdominal masses GU: No CVA tenderness.  Skin: No rashes, bruises or suspicious lesions. Lymph: No cervical or inguinal adenopathy. Neurologic: Grossly intact, no focal deficits, moving all 4 extremities. Psychiatric: Normal mood and affect.  Laboratory Data: Lab Results  Component Value  Date   WBC 9.9 01/15/2017   HGB 13.8 01/15/2017   HCT 38.7 01/15/2017   MCV 92.6 01/15/2017   PLT 263 01/15/2017    Lab Results  Component Value Date   CREATININE 0.89 01/15/2017   Urinalysis Lab Results  Component Value Date   SPECGRAV 1.020 10/01/2017   PHUR 6.5 10/01/2017   COLORU yellow 10/01/2017   APPEARANCEUR HAZY (A) 09/05/2015   LEUKOCYTESUR Small (1+) (A) 10/01/2017   PROTEINUR neg 10/01/2017   GLUCOSEU NEGATIVE 09/05/2015   KETONESU neg 10/01/2017   RBCU 0-5 09/05/2015   BILIRUBINUR neg 10/01/2017   NITRITE neg 10/01/2017    Lab Results  Component Value Date   BACTERIA RARE (A) 09/05/2015    Assessment & Plan:   1. Dysuria Clinical symptoms consistent with postcoital UTIs however urine cultures have been negative.  Will initially treat with single strength Bactrim after intercourse.  If she has persistent symptoms she will have her urinalysis and urine culture checked at employee health.  Sexual partner uses a condom and for persistent symptoms with negative cultures will pursue testing for latex allergy/sensitivity   - Urinalysis, Complete    Abbie Sons, MD  [facility] [address], [place], [place] [address] [phone]

## 2017-12-13 ENCOUNTER — Ambulatory Visit
Admission: EM | Admit: 2017-12-13 | Discharge: 2017-12-13 | Disposition: A | Discharge status: Home / Self Care | Payer: BC Managed Care – PPO | Attending: Family Medicine | Admitting: Family Medicine

## 2017-12-13 ENCOUNTER — Other Ambulatory Visit: Payer: Self-pay

## 2017-12-13 DIAGNOSIS — J0100 Acute maxillary sinusitis, unspecified: Secondary | ICD-10-CM | POA: Diagnosis not present

## 2017-12-13 DIAGNOSIS — J029 Acute pharyngitis, unspecified: Secondary | ICD-10-CM

## 2017-12-13 MED ORDER — AMOXICILLIN 875 MG PO TABS: 875.0000 mg | ORAL_TABLET | Freq: Two times a day (BID) | ORAL | 0 refills | Status: DC

## 2017-12-13 MED ORDER — FLUCONAZOLE 150 MG PO TABS: 150.0000 mg | ORAL_TABLET | Freq: Every day | ORAL | 1 refills | Status: DC

## 2017-12-13 NOTE — ED Triage Notes (Signed)
Pt with sx x Sunday. Has otalgia, drainage down back of throat causing soreness, occasionally can cough up green phlegm. Has laryngitis. Pain 6/10

## 2017-12-13 NOTE — ED Provider Notes (Signed)
[facility]    CSN: [id number] Arrival date & time: 12/13/17  6962     History   Chief Complaint Chief Complaint  Patient presents with  . Sore Throat    HPI Mandy Hughes is a 27 y.o. female.   The history is provided by the patient.  Sore Throat  Associated symptoms include headaches.  URI  Presenting symptoms: congestion, facial pain, fever, rhinorrhea and sore throat   Severity:  Moderate Onset quality:  Sudden Duration:  1 week Timing:  Constant Progression:  Worsening Chronicity:  New Relieved by:  Nothing Ineffective treatments:  OTC medications Associated symptoms: headaches and sinus pain   Associated symptoms: no wheezing   Risk factors: sick contacts   Risk factors: not elderly, no chronic cardiac disease, no chronic kidney disease, no chronic respiratory disease, no diabetes mellitus, no immunosuppression, no recent illness and no recent travel     Past Medical History:  Diagnosis Date  . Cancer ([place])    basal cell carcinoma    There are no active problems to display for this patient.   Past Surgical History:  Procedure Laterality Date  . LIPOMA EXCISION Left 2017   shoulder  . SKIN CANCER EXCISION      OB History    Gravida Para Term Preterm AB Living   0 0 0 0 0 0   SAB TAB Ectopic Multiple Live Births   0 0 0 0 0       Home Medications    Prior to Admission medications   Medication Sig Start Date End Date Taking? Authorizing Provider  amoxicillin (AMOXIL) 875 MG tablet Take 1 tablet (875 mg total) by mouth 2 (two) times daily. 12/13/17   Norval Gable, MD  BLISOVI 24 FE 1-20 MG-MCG(24) tablet  07/25/17   [provider]  fluconazole (DIFLUCAN) 150 MG tablet Take 1 tablet (150 mg total) by mouth daily. 12/13/17   Norval Gable, MD  sulfamethoxazole-trimethoprim (BACTRIM,SEPTRA) 400-80 MG tablet 1 po as directed 10/22/17   Stoioff, Ronda Fairly, MD    Family History Family History  Problem Relation Age  of Onset  . Seizures Father   . Cancer Maternal Grandfather     Social History Social History   Tobacco Use  . Smoking status: Former Smoker    Last attempt to quit: 07/13/2017    Years since quitting: 0.4  . Smokeless tobacco: Never Used  Substance Use Topics  . Alcohol use: Yes    Comment: rarely  . Drug use: No     Allergies   Tape   Review of Systems Review of Systems  Constitutional: Positive for fever.  HENT: Positive for congestion, rhinorrhea, sinus pain and sore throat.   Respiratory: Negative for wheezing.   Neurological: Positive for headaches.     Physical Exam Triage Vital Signs ED Triage Vitals  Enc Vitals Group     BP 12/13/17 0846 121/78     Pulse Rate 12/13/17 0846 93     Resp 12/13/17 0846 18     Temp 12/13/17 0846 97.7 F (36.5 C)     Temp Source 12/13/17 0846 Oral     SpO2 12/13/17 0846 100 %     Weight 12/13/17 0846 175 lb (79.4 kg)     Height 12/13/17 0846 5\' 3"  (1.6 m)     Head Circumference --      Peak Flow --      Pain Score 12/13/17 0848 6     Pain  Loc --      Pain Edu? --      Excl. in [place]? --    No data found.  Updated Vital Signs BP 121/78 (BP Location: Left Arm)   Pulse 93   Temp 97.7 F (36.5 C) (Oral)   Resp 18   Ht 5\' 3"  (1.6 m)   Wt 175 lb (79.4 kg)   LMP 11/29/2017   SpO2 100%   BMI 31.00 kg/m   Visual Acuity Right Eye Distance:   Left Eye Distance:   Bilateral Distance:    Right Eye Near:   Left Eye Near:    Bilateral Near:     Physical Exam  Constitutional: She appears well-developed and well-nourished. No distress.  HENT:  Head: Normocephalic and atraumatic.  Right Ear: Tympanic membrane, external ear and ear canal normal.  Left Ear: Tympanic membrane, external ear and ear canal normal.  Nose: Mucosal edema and rhinorrhea present. No nose lacerations, sinus tenderness, nasal deformity, septal deviation or nasal septal hematoma. No epistaxis.  No foreign bodies. Right sinus exhibits maxillary sinus  tenderness and frontal sinus tenderness. Left sinus exhibits maxillary sinus tenderness and frontal sinus tenderness.  Mouth/Throat: Uvula is midline, oropharynx is clear and moist and mucous membranes are normal. No oropharyngeal exudate.  Eyes: Conjunctivae and EOM are normal. Pupils are equal, round, and reactive to light. Right eye exhibits no discharge. Left eye exhibits no discharge. No scleral icterus.  Neck: Normal range of motion. Neck supple. No thyromegaly present.  Cardiovascular: Normal rate, regular rhythm and normal heart sounds.  Pulmonary/Chest: Effort normal and breath sounds normal. No respiratory distress. She has no wheezes. She has no rales.  Lymphadenopathy:    She has no cervical adenopathy.  Skin: She is not diaphoretic.  Nursing note and vitals reviewed.    UC Treatments / Results  Labs (all labs ordered are listed, but only abnormal results are displayed) Labs Reviewed - No data to display  EKG  EKG Interpretation None       Radiology No results found.  Procedures Procedures (including critical care time)  Medications Ordered in UC Medications - No data to display   Initial Impression / Assessment and Plan / UC Course  I have reviewed the triage vital signs and the nursing notes.  Pertinent labs & imaging results that were available during my care of the patient were reviewed by me and considered in my medical decision making (see chart for details).       Final Clinical Impressions(s) / UC Diagnoses   Final diagnoses:  Acute maxillary sinusitis, recurrence not specified  Sore throat    ED Discharge Orders        Ordered    amoxicillin (AMOXIL) 875 MG tablet  2 times daily     12/13/17 0924    fluconazole (DIFLUCAN) 150 MG tablet  Daily     12/13/17 0924     1. diagnosis reviewed with patient 2. rx as per orders above; reviewed possible side effects, interactions, risks and benefits  3. Recommend supportive treatment with rest,  fluids 4. Follow-up prn if symptoms worsen or don't improve  Controlled Substance Prescriptions [place] Controlled Substance Registry consulted? Not Applicable   Norval Gable, MD 12/13/17 [phone]

## 2017-12-26 ENCOUNTER — Encounter: Payer: Self-pay | Admitting: Obstetrics and Gynecology

## 2017-12-26 ENCOUNTER — Ambulatory Visit (INDEPENDENT_AMBULATORY_CARE_PROVIDER_SITE_OTHER): Payer: BC Managed Care – PPO | Admitting: Obstetrics and Gynecology

## 2017-12-26 VITALS — BP 118/71 | HR 85 | Ht 63.0 in | Wt 177.3 lb

## 2017-12-26 DIAGNOSIS — Z3009 Encounter for other general counseling and advice on contraception: Secondary | ICD-10-CM | POA: Diagnosis not present

## 2017-12-26 DIAGNOSIS — Z0000 Encounter for general adult medical examination without abnormal findings: Secondary | ICD-10-CM | POA: Diagnosis not present

## 2017-12-26 NOTE — Progress Notes (Signed)
HPI:      [name] is a 27 y.o. G0P0000 who LMP was Patient's last menstrual period was 11/29/2017 (exact date).  Subjective:   She presents today for her annual examination.  She is unhappy with her OCPs.  She believes that it is causing more acne and a decreased sex drive with vaginal dryness.  She is strongly interested in an IUD. Patient states that she has noticed an increase in anxiety and panic attacks.  She has not had a full-blown panic attack and has been able to control some of her symptoms.  She declined referral.    Hx: The following portions of the patient's history were reviewed and updated as appropriate:             She  has a past medical history of Cancer ([place]). She does not have a problem list on file. She  has a past surgical history that includes Lipoma excision (Left, 2017) and Skin cancer excision. Her family history includes Cancer in her maternal grandfather; Seizures in her father. She  reports that she quit smoking about 5 months ago. she has never used smokeless tobacco. She reports that she drinks alcohol. She reports that she does not use drugs. She is allergic to tape.       Review of Systems:  Review of Systems  Constitutional: Denied constitutional symptoms, night sweats, recent illness, fatigue, fever, insomnia and weight loss.  Eyes: Denied eye symptoms, eye pain, photophobia, vision change and visual disturbance.  Ears/Nose/Throat/Neck: Denied ear, nose, throat or neck symptoms, hearing loss, nasal discharge, sinus congestion and sore throat.  Cardiovascular: Denied cardiovascular symptoms, arrhythmia, chest pain/pressure, edema, exercise intolerance, orthopnea and palpitations.  Respiratory: Denied pulmonary symptoms, asthma, pleuritic pain, productive sputum, cough, dyspnea and wheezing.  Gastrointestinal: Denied, gastro-esophageal reflux, melena, nausea and vomiting.  Genitourinary: See HPI for additional information.   Musculoskeletal: Denied musculoskeletal symptoms, stiffness, swelling, muscle weakness and myalgia.  Dermatologic: Denied dermatology symptoms, rash and scar.  Neurologic: Denied neurology symptoms, dizziness, headache, neck pain and syncope.  Psychiatric: See HPI for additional information.  Endocrine: Denied endocrine symptoms including hot flashes and night sweats.   Meds:   Current Outpatient Medications on File Prior to Visit  Medication Sig Dispense Refill  . BLISOVI 24 FE 1-20 MG-MCG(24) tablet     . sulfamethoxazole-trimethoprim (BACTRIM,SEPTRA) 400-80 MG tablet 1 po as directed 30 tablet 0   No current facility-administered medications on file prior to visit.     Objective:     Vitals:   12/26/17 1011  BP: 118/71  Pulse: 85              Physical examination General NAD, Conversant  HEENT Atraumatic; Op clear with mmm.  Normo-cephalic. Pupils reactive. Anicteric sclerae  Thyroid/Neck Smooth without nodularity or enlargement. Normal ROM.  Neck Supple.  Skin No rashes, lesions or ulceration. Normal palpated skin turgor. No nodularity.  Breasts: No masses or discharge.  Symmetric.  No axillary adenopathy.  Lungs: Clear to auscultation.No rales or wheezes. Normal Respiratory effort, no retractions.  Heart: NSR.  No murmurs or rubs appreciated. No periferal edema  Abdomen: Soft.  Non-tender.  No masses.  No HSM. No hernia  Extremities: Moves all appropriately.  Normal ROM for age. No lymphadenopathy.  Neuro: Oriented to PPT.  Normal mood. Normal affect.     Pelvic:   Vulva: Normal appearance.  No lesions.  Vagina: No lesions or abnormalities noted.  Support: Normal pelvic support.  Urethra  No masses tenderness or scarring.  Meatus Normal size without lesions or prolapse.  Cervix: Normal appearance.  No lesions.  Anus: Normal exam.  No lesions.  Perineum: Normal exam.  No lesions.        Bimanual   Uterus: Normal size.  Non-tender.  Mobile.  Mid to retroverted  position  Adnexae: No masses.  Non-tender to palpation.  Cul-de-sac: Negative for abnormality.      Assessment:    G0P0000 There are no active problems to display for this patient.    1. Encounter for annual physical exam   2. Birth control counseling        Plan:            1.  Basic Screening Recommendations The basic screening recommendations for asymptomatic women were discussed with the patient during her visit.  The age-appropriate recommendations were discussed with her and the rational for the tests reviewed.  When I am informed by the patient that another primary care physician has previously obtained the age-appropriate tests and they are up-to-date, only outstanding tests are ordered and referrals given as necessary.  Abnormal results of tests will be discussed with her when all of her results are completed. Pap performed  2.  Birth Control I discussed multiple birth control options and methods with the patient.  The risks and benefits of each were reviewed. IUD Literature on [place] given.  Risks and benefits of each discussed.  She is considering IUD as an option for birth/cycle control. Patient has decided upon Mirena IUD.  She will stop her OCPs 4 days before visit begin her menstrual period and have an IUD inserted. Orders No orders of the defined types were placed in this encounter.   No orders of the defined types were placed in this encounter.       F/U  Return in about 2 weeks (around 01/09/2018).  Finis Bud, M.D. 12/26/2017 11:03 AM

## 2017-12-26 NOTE — Addendum Note (Signed)
Addended by: Raliegh Ip on: 12/26/2017 11:13 AM   Modules accepted: Orders

## 2017-12-30 LAB — PAP IG, CT-NG, RFX HPV ASCU
Chlamydia, Nuc. Acid Amp: NEGATIVE
GONOCOCCUS BY NUCLEIC ACID AMP: NEGATIVE
PAP SMEAR COMMENT: 0

## 2018-01-02 ENCOUNTER — Encounter: Payer: Self-pay | Admitting: Obstetrics and Gynecology

## 2018-01-09 ENCOUNTER — Ambulatory Visit (INDEPENDENT_AMBULATORY_CARE_PROVIDER_SITE_OTHER): Payer: BC Managed Care – PPO | Admitting: Obstetrics and Gynecology

## 2018-01-09 ENCOUNTER — Encounter: Payer: Self-pay | Admitting: Obstetrics and Gynecology

## 2018-01-09 VITALS — BP 129/80 | HR 99 | Ht 64.0 in | Wt 180.4 lb

## 2018-01-09 DIAGNOSIS — Z23 Encounter for immunization: Secondary | ICD-10-CM | POA: Diagnosis not present

## 2018-01-09 DIAGNOSIS — Z30430 Encounter for insertion of intrauterine contraceptive device: Secondary | ICD-10-CM | POA: Diagnosis not present

## 2018-01-09 NOTE — Addendum Note (Signed)
Addended by: Raliegh Ip on: 01/09/2018 04:56 PM   Modules accepted: Orders

## 2018-01-09 NOTE — Progress Notes (Signed)
HPI:      [name] is a 28 y.o. G0P0000 who LMP was Patient's last menstrual period was 01/09/2018 (exact date).  Subjective:   She presents today for IUD insertion.      Hx: The following portions of the patient's history were reviewed and updated as appropriate:             She  has a past medical history of Cancer ([place]). She does not have a problem list on file. She  has a past surgical history that includes Lipoma excision (Left, 2017) and Skin cancer excision. Her family history includes Cancer in her maternal grandfather; Seizures in her father. She  reports that she quit smoking about 5 months ago. she has never used smokeless tobacco. She reports that she drinks alcohol. She reports that she does not use drugs. She is allergic to tape.       Review of Systems:  Review of Systems  Constitutional: Denied constitutional symptoms, night sweats, recent illness, fatigue, fever, insomnia and weight loss.  Eyes: Denied eye symptoms, eye pain, photophobia, vision change and visual disturbance.  Ears/Nose/Throat/Neck: Denied ear, nose, throat or neck symptoms, hearing loss, nasal discharge, sinus congestion and sore throat.  Cardiovascular: Denied cardiovascular symptoms, arrhythmia, chest pain/pressure, edema, exercise intolerance, orthopnea and palpitations.  Respiratory: Denied pulmonary symptoms, asthma, pleuritic pain, productive sputum, cough, dyspnea and wheezing.  Gastrointestinal: Denied, gastro-esophageal reflux, melena, nausea and vomiting.  Genitourinary: Denied genitourinary symptoms including symptomatic vaginal discharge, pelvic relaxation issues, and urinary complaints.  Musculoskeletal: Denied musculoskeletal symptoms, stiffness, swelling, muscle weakness and myalgia.  Dermatologic: Denied dermatology symptoms, rash and scar.  Neurologic: Denied neurology symptoms, dizziness, headache, neck pain and syncope.  Psychiatric: Denied psychiatric symptoms,  anxiety and depression.  Endocrine: Denied endocrine symptoms including hot flashes and night sweats.   Meds:   Current Outpatient Medications on File Prior to Visit  Medication Sig Dispense Refill  . BLISOVI 24 FE 1-20 MG-MCG(24) tablet     . sulfamethoxazole-trimethoprim (BACTRIM,SEPTRA) 400-80 MG tablet 1 po as directed (Patient not taking: Reported on 01/09/2018) 30 tablet 0   No current facility-administered medications on file prior to visit.     Objective:     Vitals:   01/09/18 1538  BP: 129/80  Pulse: 99    Physical examination   Pelvic:   Vulva: Normal appearance.  No lesions.  Vagina: No lesions or abnormalities noted.  Support: Normal pelvic support.  Urethra No masses tenderness or scarring.  Meatus Normal size without lesions or prolapse.  Cervix: Normal appearance.  No lesions.  Anus: Normal exam.  No lesions.  Perineum: Normal exam.  No lesions.        Bimanual   Uterus: Normal size.  Non-tender.  Mobile.  Retroverted  Adnexae: No masses.  Non-tender to palpation.  Cul-de-sac: Negative for abnormality.   IUD Procedure Pt has read the booklet and signed the appropriate forms regarding the Mirena IUD.  All of her questions have been answered.   The cervix was cleansed with betadine solution.  After sounding the uterus and noting the position, the IUD was placed in the usual manner without problem.  The string was cut to the appropriate length.  The patient tolerated the procedure well.              Assessment:    G0P0000 There are no active problems to display for this patient.    1. Encounter for insertion of mirena IUD  Plan:             F/U  Return in about 4 weeks (around 02/06/2018).  Finis Bud, M.D. 01/09/2018 4:03 PM

## 2018-01-14 ENCOUNTER — Encounter: Payer: Self-pay | Admitting: Obstetrics and Gynecology

## 2018-01-16 ENCOUNTER — Telehealth: Payer: Self-pay

## 2018-01-16 NOTE — Telephone Encounter (Signed)
mychart message sent

## 2018-01-26 ENCOUNTER — Encounter: Payer: Self-pay | Admitting: Obstetrics and Gynecology

## 2018-02-03 ENCOUNTER — Other Ambulatory Visit (INDEPENDENT_AMBULATORY_CARE_PROVIDER_SITE_OTHER): Payer: Self-pay

## 2018-02-04 ENCOUNTER — Ambulatory Visit (INDEPENDENT_AMBULATORY_CARE_PROVIDER_SITE_OTHER): Payer: BC Managed Care – PPO | Admitting: Obstetrics and Gynecology

## 2018-02-04 ENCOUNTER — Encounter: Payer: Self-pay | Admitting: Obstetrics and Gynecology

## 2018-02-04 ENCOUNTER — Ambulatory Visit (INDEPENDENT_AMBULATORY_CARE_PROVIDER_SITE_OTHER): Payer: BC Managed Care – PPO

## 2018-02-04 VITALS — BP 134/84 | HR 116 | Ht 63.0 in | Wt 177.3 lb

## 2018-02-04 DIAGNOSIS — R102 Pelvic and perineal pain: Secondary | ICD-10-CM | POA: Diagnosis not present

## 2018-02-04 DIAGNOSIS — R1030 Lower abdominal pain, unspecified: Secondary | ICD-10-CM

## 2018-02-04 LAB — POCT URINALYSIS DIPSTICK
BILIRUBIN UA: NEGATIVE
Glucose, UA: NEGATIVE
Ketones, UA: NEGATIVE
Leukocytes, UA: NEGATIVE
Nitrite, UA: NEGATIVE
PH UA: 6.5 (ref 5.0–8.0)
Protein, UA: NEGATIVE
Spec Grav, UA: 1.015 (ref 1.010–1.025)
UROBILINOGEN UA: 0.2 U/dL

## 2018-02-04 NOTE — Progress Notes (Signed)
HPI:      Mandy Hughes is a 28 y.o. G0P0000 who LMP was Patient's last menstrual period was 01/09/2018 (exact date).  Subjective:   She presents today with complaint of pelvic pain which is worse toward the end of the day.  She states that after her IUD was inserted she had pelvic pain for 2 days.  It has improved since then but has not completely resolved.  She reports daily spotting.  She denies urinary symptoms or bowel symptoms.  She states that she has felt the strings.    Hx: The following portions of the patient's history were reviewed and updated as appropriate:             She  has a past medical history of Cancer ([place]). She does not have a problem list on file. She  has a past surgical history that includes Lipoma excision (Left, 2017) and Skin cancer excision. Her family history includes Cancer in her maternal grandfather; Seizures in her father. She  reports that she quit smoking about 6 months ago. she has never used smokeless tobacco. She reports that she drinks alcohol. She reports that she does not use drugs. She is allergic to tape.       Review of Systems:  Review of Systems  Constitutional: Denied constitutional symptoms, night sweats, recent illness, fatigue, fever, insomnia and weight loss.  Eyes: Denied eye symptoms, eye pain, photophobia, vision change and visual disturbance.  Ears/Nose/Throat/Neck: Denied ear, nose, throat or neck symptoms, hearing loss, nasal discharge, sinus congestion and sore throat.  Cardiovascular: Denied cardiovascular symptoms, arrhythmia, chest pain/pressure, edema, exercise intolerance, orthopnea and palpitations.  Respiratory: Denied pulmonary symptoms, asthma, pleuritic pain, productive sputum, cough, dyspnea and wheezing.  Gastrointestinal: Denied, gastro-esophageal reflux, melena, nausea and vomiting.  Genitourinary: See HPI for additional information.  Musculoskeletal: Denied musculoskeletal symptoms, stiffness, swelling,  muscle weakness and myalgia.  Dermatologic: Denied dermatology symptoms, rash and scar.  Neurologic: Denied neurology symptoms, dizziness, headache, neck pain and syncope.  Psychiatric: Denied psychiatric symptoms, anxiety and depression.  Endocrine: Denied endocrine symptoms including hot flashes and night sweats.   Meds:   Current Outpatient Medications on File Prior to Visit  Medication Sig Dispense Refill  . levonorgestrel (MIRENA) 20 MCG/24HR IUD 1 each by Intrauterine route once.    Marland Kitchen BLISOVI 24 FE 1-20 MG-MCG(24) tablet     . sulfamethoxazole-trimethoprim (BACTRIM,SEPTRA) 400-80 MG tablet 1 po as directed (Patient not taking: Reported on 01/09/2018) 30 tablet 0   No current facility-administered medications on file prior to visit.     Objective:     Vitals:   02/04/18 0830  BP: 134/84  Pulse: (!) 116              Physical examination   Pelvic:   Vulva: Normal appearance.  No lesions.  Vagina: No lesions or abnormalities noted.  Support: Normal pelvic support.  Urethra No masses tenderness or scarring.  Meatus Normal size without lesions or prolapse.  Cervix: Normal appearance.  No lesions. IUD strings noted at cervical os.  Anus: Normal exam.  No lesions.  Perineum: Normal exam.  No lesions.        Bimanual   Uterus: Normal size.  Non-tender.  Mobile.  AV.  Adnexae: No masses.  Non-tender to palpation.  Cul-de-sac: Negative for abnormality.     Assessment:    G0P0000    1. Pelvic pain in female   2. Lower abdominal pain     IUD appears  placed correctly based on string length.  Patient with symptomatic intermittent pelvic pain.   Plan:            1.  Pelvic ultrasound for IUD placement or ovarian cyst.  2.  We have talked to multiple scenarios involving keeping the IUD versus removal.  All questions answered. Orders Orders Placed This Encounter  Procedures  . US Transvaginal Non-OB  . POCT urinalysis dipstick    No orders of the defined types  were placed in this encounter.     F/U  No Follow-up on file.  Finis Bud, M.D. 02/04/2018 9:13 AM

## 2018-02-06 ENCOUNTER — Encounter: Payer: BC Managed Care – PPO | Admitting: Obstetrics and Gynecology

## 2018-09-05 ENCOUNTER — Ambulatory Visit
Admission: EM | Admit: 2018-09-05 | Discharge: 2018-09-05 | Disposition: A | Discharge status: Home / Self Care | Payer: BC Managed Care – PPO | Attending: Emergency Medicine | Admitting: Emergency Medicine

## 2018-09-05 ENCOUNTER — Other Ambulatory Visit: Payer: Self-pay

## 2018-09-05 ENCOUNTER — Encounter: Payer: Self-pay | Admitting: Emergency Medicine

## 2018-09-05 DIAGNOSIS — G43909 Migraine, unspecified, not intractable, without status migrainosus: Secondary | ICD-10-CM | POA: Diagnosis not present

## 2018-09-05 MED ORDER — PROMETHAZINE HCL 25 MG/ML IJ SOLN
25.0000 mg | Freq: Once | INTRAMUSCULAR | Status: AC
  Administered 2018-09-05: 25 mg via INTRAMUSCULAR

## 2018-09-05 MED ORDER — DEXAMETHASONE SODIUM PHOSPHATE 10 MG/ML IJ SOLN
10.0000 mg | Freq: Once | INTRAMUSCULAR | Status: AC
  Administered 2018-09-05: 10 mg via INTRAMUSCULAR

## 2018-09-05 MED ORDER — BUTALBITAL-APAP-CAFFEINE 50-325-40 MG PO TABS: 1.0000 | ORAL_TABLET | Freq: Four times a day (QID) | ORAL | 0 refills | Status: AC | PRN

## 2018-09-05 MED ORDER — ONDANSETRON 8 MG PO TBDP: 8.0000 mg | ORAL_TABLET | Freq: Once | ORAL | Status: DC

## 2018-09-05 MED ORDER — KETOROLAC TROMETHAMINE 60 MG/2ML IM SOLN
60.0000 mg | Freq: Once | INTRAMUSCULAR | Status: AC
  Administered 2018-09-05: 60 mg via INTRAMUSCULAR

## 2018-09-05 NOTE — ED Triage Notes (Signed)
Patient c/o migraine HA for the past 3 days.  Patient HA has not gotten better and is now having pain in her neck and shoulders.  Patient reports blurry vision.

## 2018-09-05 NOTE — Discharge Instructions (Signed)
If your symptoms worsen  go to the emergency room

## 2018-09-06 NOTE — ED Provider Notes (Signed)
[facility]    CSN: [id number] Arrival date & time: 09/05/18  1411     History   Chief Complaint Chief Complaint  Patient presents with  . Migraine    HPI Mandy Hughes is a 28 y.o. female.   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  Tried friends migraine medications were not beneficial.  States that that she is had 3 episodes of a bright light in her left eye she describes as possible optic migraine.  Had one earlier today which prompted the visit.  Has blurry vision from the headache and also has mild photosensitivity but is able to tolerate ambient light.  Has had nausea but no vomiting.  Is any neurological symptoms other than those already mentioned.  Has not had any recent rest because of the pain.        Past Medical History:  Diagnosis Date  . Cancer ([place])    basal cell carcinoma    There are no active problems to display for this patient.   Past Surgical History:  Procedure Laterality Date  . LIPOMA EXCISION Left 2017   shoulder  . SKIN CANCER EXCISION      OB History    Gravida  0   Para  0   Term  0   Preterm  0   AB  0   Living  0     SAB  0   TAB  0   Ectopic  0   Multiple  0   Live Births  0            Home Medications    Prior to Admission medications   Medication Sig Start Date End Date Taking? Authorizing Provider  levonorgestrel (MIRENA) 20 MCG/24HR IUD 1 each by Intrauterine route once.   Yes [provider]  butalbital-acetaminophen-caffeine (FIORICET, ESGIC) 50-325-40 MG tablet Take 1-2 tablets by mouth every 6 (six) hours as needed for headache. 09/05/18 09/05/19  Lorin Picket, PA-C    Family History Family History  Problem Relation Age of Onset  . Seizures Father   . Cancer Maternal Grandfather     Social History Social History   Tobacco Use  . Smoking status: Former Smoker    Last attempt to quit: 07/13/2017    Years since  quitting: 1.1  . Smokeless tobacco: Never Used  Substance Use Topics  . Alcohol use: Yes    Comment: rarely  . Drug use: No     Allergies   Tape   Review of Systems Review of Systems  Constitutional: Positive for activity change, appetite change and fatigue. Negative for chills and fever.  Eyes: Positive for photophobia and visual disturbance.  Neurological: Positive for headaches. Negative for facial asymmetry.  All other systems reviewed and are negative.    Physical Exam Triage Vital Signs ED Triage Vitals  Enc Vitals Group     BP 09/05/18 1519 125/79     Pulse Rate 09/05/18 1519 77     Resp 09/05/18 1519 14     Temp 09/05/18 1519 98.4 F (36.9 C)     Temp Source 09/05/18 1519 Oral     SpO2 09/05/18 1519 100 %     Weight 09/05/18 1517 175 lb (79.4 kg)     Height 09/05/18 1517 5\' 4"  (1.626 m)     Head Circumference --      Peak Flow --  Pain Score 09/05/18 1516 8     Pain Loc --      Pain Edu? --      Excl. in [place]? --    No data found.  Updated Vital Signs BP 125/79 (BP Location: Left Arm)   Pulse 77   Temp 98.4 F (36.9 C) (Oral)   Resp 14   Ht 5\' 4"  (1.626 m)   Wt 175 lb (79.4 kg)   LMP 08/15/2018   SpO2 100%   BMI 30.04 kg/m   Visual Acuity Right Eye Distance:   Left Eye Distance:   Bilateral Distance:    Right Eye Near:   Left Eye Near:    Bilateral Near:     Physical Exam  Constitutional: She is oriented to person, place, and time. She appears well-developed and well-nourished. No distress.  HENT:  Head: Normocephalic.  Eyes: Pupils are equal, round, and reactive to light. EOM are normal. Right eye exhibits no discharge. Left eye exhibits no discharge.  Neck: Normal range of motion. Neck supple.  Musculoskeletal: Normal range of motion.  Neurological: She is alert and oriented to person, place, and time. She displays normal reflexes. No cranial nerve deficit or sensory deficit. She exhibits normal muscle tone. Coordination normal.    Skin: Skin is warm and dry. She is not diaphoretic.  Psychiatric: She has a normal mood and affect. Her behavior is normal. Judgment and thought content normal.  Nursing note and vitals reviewed.    UC Treatments / Results  Labs (all labs ordered are listed, but only abnormal results are displayed) Labs Reviewed - No data to display  EKG None  Radiology No results found.  Procedures Procedures (including critical care time)  Medications Ordered in UC Medications  ketorolac (TORADOL) injection 60 mg (60 mg Intramuscular Given 09/05/18 1640)  dexamethasone (DECADRON) injection 10 mg (10 mg Intramuscular Given 09/05/18 1641)  promethazine (PHENERGAN) injection 25 mg (25 mg Intramuscular Given 09/05/18 1648)   1/2 hour after receiving the above medications patient states that her pain level was down to a 1 from a high of 7 out of 10 Initial Impression / Assessment and Plan / UC Course  I have reviewed the triage vital signs and the nursing notes.  Pertinent labs & imaging results that were available during my care of the patient were reviewed by me and considered in my medical decision making (see chart for details).   Patient will follow up with an ophthalmologist next week.  Prescription for Fioricet to take if the headache seems to be returning.  Routine migraine medications have not been proven effective for her.  [facility] asked her to follow-up with her primary care physician regarding the migraines.   Final Clinical Impressions(s) / UC Diagnoses   Final diagnoses:  Migraine without status migrainosus, not intractable, unspecified migraine type     Discharge Instructions     If your symptoms worsen  go to the emergency room   ED Prescriptions    Medication Sig Dispense Auth. Provider   butalbital-acetaminophen-caffeine (FIORICET, ESGIC) 50-325-40 MG tablet Take 1-2 tablets by mouth every 6 (six) hours as needed for headache. 30 tablet Lorin Picket, PA-C      Controlled Substance Prescriptions [place] Controlled Substance Registry consulted? Not Applicable   Lorin Picket, PA-C 09/06/18 1801

## 2018-11-10 ENCOUNTER — Ambulatory Visit: Payer: BC Managed Care – PPO | Admitting: Nurse Practitioner

## 2019-02-06 ENCOUNTER — Encounter: Payer: Self-pay | Admitting: Obstetrics and Gynecology

## 2019-02-06 ENCOUNTER — Ambulatory Visit: Payer: BC Managed Care – PPO | Admitting: Obstetrics and Gynecology

## 2019-02-06 VITALS — BP 148/82 | HR 88 | Ht 63.0 in | Wt 184.0 lb

## 2019-02-06 DIAGNOSIS — A5901 Trichomonal vulvovaginitis: Secondary | ICD-10-CM | POA: Diagnosis not present

## 2019-02-06 DIAGNOSIS — N898 Other specified noninflammatory disorders of vagina: Secondary | ICD-10-CM

## 2019-02-06 MED ORDER — METRONIDAZOLE 500 MG PO TABS: ORAL_TABLET | ORAL | 0 refills | Status: DC

## 2019-02-06 NOTE — Progress Notes (Signed)
HPI:      [name] is a 29 y.o. G0P0000 who LMP was No LMP recorded. (Menstrual status: IUD).  Subjective:   She presents today with complaint of heavy vaginal discharge and significant vulvar vaginal irritation. She denies new sexual partners. Her pelvic pain that she was experiencing at her past visit has resolved.    Hx: The following portions of the patient's history were reviewed and updated as appropriate:             She  has a past medical history of Cancer ([place]). She does not have a problem list on file. She  has a past surgical history that includes Lipoma excision (Left, 2017) and Skin cancer excision. Her family history includes Cancer in her maternal grandfather; Seizures in her father. She  reports that she quit smoking about 18 months ago. She has never used smokeless tobacco. She reports current alcohol use. She reports that she does not use drugs. She has a current medication list which includes the following prescription(s): butalbital-acetaminophen-caffeine, levonorgestrel, and metronidazole. She is allergic to tape.       Review of Systems:  Review of Systems  Constitutional: Denied constitutional symptoms, night sweats, recent illness, fatigue, fever, insomnia and weight loss.  Eyes: Denied eye symptoms, eye pain, photophobia, vision change and visual disturbance.  Ears/Nose/Throat/Neck: Denied ear, nose, throat or neck symptoms, hearing loss, nasal discharge, sinus congestion and sore throat.  Cardiovascular: Denied cardiovascular symptoms, arrhythmia, chest pain/pressure, edema, exercise intolerance, orthopnea and palpitations.  Respiratory: Denied pulmonary symptoms, asthma, pleuritic pain, productive sputum, cough, dyspnea and wheezing.  Gastrointestinal: Denied, gastro-esophageal reflux, melena, nausea and vomiting.  Genitourinary: See HPI for additional information.  Musculoskeletal: Denied musculoskeletal symptoms, stiffness, swelling, muscle  weakness and myalgia.  Dermatologic: Denied dermatology symptoms, rash and scar.  Neurologic: Denied neurology symptoms, dizziness, headache, neck pain and syncope.  Psychiatric: Denied psychiatric symptoms, anxiety and depression.  Endocrine: Denied endocrine symptoms including hot flashes and night sweats.   Meds:   Current Outpatient Medications on File Prior to Visit  Medication Sig Dispense Refill  . butalbital-acetaminophen-caffeine (FIORICET, ESGIC) 50-325-40 MG tablet Take 1-2 tablets by mouth every 6 (six) hours as needed for headache. 30 tablet 0  . levonorgestrel (MIRENA) 20 MCG/24HR IUD 1 each by Intrauterine route once.     No current facility-administered medications on file prior to visit.     Objective:     Vitals:   02/06/19 0927  BP: (!) 148/82  Pulse: 88              Physical examination   Pelvic:   Vulva: Normal appearance.  No lesions.  Significant vulvar erythema  Vagina: No lesions or abnormalities noted.  Support: Normal pelvic support.  Urethra No masses tenderness or scarring.  Meatus Normal size without lesions or prolapse.  Cervix: Normal appearance.  No lesions.  Anus: Normal exam.  No lesions.  Perineum: Normal exam.  No lesions.        Bimanual   Uterus: Normal size.  Non-tender.  Mobile.  AV.  Adnexae: No masses.  Non-tender to palpation.  Cul-de-sac: Negative for abnormality.   WET PREP: clue cells: present, KOH (yeast): negative, odor: absent and trichomoniasis: positive Ph:  < 4.5   Assessment:    G0P0000 There are no active problems to display for this patient.    1. Trichomonal vaginitis   2. Vaginal discharge        Plan:  1.  GC/CT run from urine because of the STD nature of trichomonas  2.  Flagyl for trichomonas -advised treatment of all partners Orders No orders of the defined types were placed in this encounter.    Meds ordered this encounter  Medications  . metroNIDAZOLE (FLAGYL) 500 MG tablet     Sig: Take two tablets by mouth twice a day, for one day.  Or you can take all four tablets at once if you can tolerate it.    Dispense:  4 tablet    Refill:  0      F/U  Return for We will contact her with any abnormal test results. I spent 18 minutes involved in the care of this patient of which greater than 50% was spent discussing STDs, trichomonas, treatment of trichomonas, treatment of partners.  All questions answered  Finis Bud, M.D. 02/06/2019 10:08 AM

## 2019-02-06 NOTE — Addendum Note (Signed)
Addended by: Durwin Glaze on: 02/06/2019 10:34 AM   Modules accepted: Orders

## 2019-02-06 NOTE — Progress Notes (Signed)
Patient comes in today for labia pain and vaginal discharge. Patient states that the discharge is thick and white.

## 2019-02-12 LAB — GC/CHLAMYDIA PROBE AMP
CHLAMYDIA, DNA PROBE: NEGATIVE
Neisseria gonorrhoeae by PCR: NEGATIVE

## 2019-06-24 ENCOUNTER — Ambulatory Visit: Payer: BC Managed Care – PPO | Admitting: Obstetrics and Gynecology

## 2019-06-25 ENCOUNTER — Ambulatory Visit: Payer: BC Managed Care – PPO | Admitting: Obstetrics and Gynecology

## 2019-06-25 ENCOUNTER — Encounter: Payer: Self-pay | Admitting: Obstetrics and Gynecology

## 2019-06-25 ENCOUNTER — Other Ambulatory Visit: Payer: Self-pay

## 2019-06-25 VITALS — BP 112/74 | HR 80 | Ht 63.0 in | Wt 180.0 lb

## 2019-06-25 DIAGNOSIS — Z3009 Encounter for other general counseling and advice on contraception: Secondary | ICD-10-CM

## 2019-06-25 DIAGNOSIS — Z30432 Encounter for removal of intrauterine contraceptive device: Secondary | ICD-10-CM

## 2019-06-25 DIAGNOSIS — R102 Pelvic and perineal pain: Secondary | ICD-10-CM

## 2019-06-25 DIAGNOSIS — N83201 Unspecified ovarian cyst, right side: Secondary | ICD-10-CM | POA: Diagnosis not present

## 2019-06-25 MED ORDER — DESOGESTREL-ETHINYL ESTRADIOL 0.15-0.02/0.01 MG (21/5) PO TABS: 1.0000 | ORAL_TABLET | Freq: Every day | ORAL | 2 refills | Status: DC

## 2019-06-25 NOTE — Progress Notes (Signed)
Patient comes in today for IUD removal. She would like to go on [facility] pill or use nuva ring.

## 2019-06-25 NOTE — Progress Notes (Signed)
HPI:      [name] is a 29 y.o. G0P0000 who LMP was Patient's last menstrual period was 06/21/2019.  Subjective:   She presents today because she is considering IUD removal.  She says she has never really been completely happy with the IUD.  She now has a small right ovarian cyst which diagnosed by CT scan approximately 3 cm-simple.  She says she continues to experience pain from this although she has not had a follow-up imaging study in several months.  She also began having migraine headaches and she thought maybe the IUD was causing this although she has taken OCPs in the past without evidence of migraine. After a very long discussion regarding other forms of birth control the risks and benefits of keeping the IUD versus trying other birth control she has decided to have the IUD removed.    Hx: The following portions of the patient's history were reviewed and updated as appropriate:             She  has a past medical history of Cancer ([place]). She does not have a problem list on file. She  has a past surgical history that includes Lipoma excision (Left, 2017) and Skin cancer excision. Her family history includes Cancer in her maternal grandfather; Seizures in her father. She  reports that she quit smoking about 1 years ago. She has never used smokeless tobacco. She reports current alcohol use. She reports that she does not use drugs. She has a current medication list which includes the following prescription(s): butalbital-acetaminophen-caffeine, desogestrel-ethinyl estradiol, levonorgestrel, and metronidazole. She is allergic to tape.       Review of Systems:  Review of Systems  Constitutional: Denied constitutional symptoms, night sweats, recent illness, fatigue, fever, insomnia and weight loss.  Eyes: Denied eye symptoms, eye pain, photophobia, vision change and visual disturbance.  Ears/Nose/Throat/Neck: Denied ear, nose, throat or neck symptoms, hearing loss, nasal  discharge, sinus congestion and sore throat.  Cardiovascular: Denied cardiovascular symptoms, arrhythmia, chest pain/pressure, edema, exercise intolerance, orthopnea and palpitations.  Respiratory: Denied pulmonary symptoms, asthma, pleuritic pain, productive sputum, cough, dyspnea and wheezing.  Gastrointestinal: Denied, gastro-esophageal reflux, melena, nausea and vomiting.  Genitourinary: Denied genitourinary symptoms including symptomatic vaginal discharge, pelvic relaxation issues, and urinary complaints.  Musculoskeletal: Denied musculoskeletal symptoms, stiffness, swelling, muscle weakness and myalgia.  Dermatologic: Denied dermatology symptoms, rash and scar.  Neurologic: Denied neurology symptoms, dizziness, headache, neck pain and syncope.  Psychiatric: Denied psychiatric symptoms, anxiety and depression.  Endocrine: Denied endocrine symptoms including hot flashes and night sweats.   Meds:   Current Outpatient Medications on File Prior to Visit  Medication Sig Dispense Refill  . butalbital-acetaminophen-caffeine (FIORICET, ESGIC) 50-325-40 MG tablet Take 1-2 tablets by mouth every 6 (six) hours as needed for headache. 30 tablet 0  . levonorgestrel (MIRENA) 20 MCG/24HR IUD 1 each by Intrauterine route once.    . metroNIDAZOLE (FLAGYL) 500 MG tablet Take two tablets by mouth twice a day, for one day.  Or you can take all four tablets at once if you can tolerate it. 4 tablet 0   No current facility-administered medications on file prior to visit.     Objective:     Vitals:   06/25/19 1447  BP: 112/74  Pulse: 80              Physical examination   Pelvic:   Vulva: Normal appearance.  No lesions.  Vagina: No lesions or abnormalities noted.  Support: Normal  pelvic support.  Urethra No masses tenderness or scarring.  Meatus Normal size without lesions or prolapse.  Cervix: Normal appearance.  No lesions.  IUD strings noted at the cervical loss  Anus: Normal exam.  No  lesions.  Perineum: Normal exam.  No lesions.        Bimanual   Uterus: Normal size.  Non-tender.  Mobile.  AV.  Adnexae: No masses.  Non-tender to palpation.  Cul-de-sac: Negative for abnormality.   IUD Removal Strings of IUD identified and grasped.  IUD removed without problem.  Pt tolerated this well.  IUD noted to be intact.     Assessment:    G0P0000 There are no active problems to display for this patient.    1. Pelvic pain in female   2. Cyst of right ovary   3. Encounter for IUD removal   4. Birth control counseling        Plan:            1.  OCPs The risks /benefits of OCPs have been explained to the patient in detail.  Product literature has been given to her.  I have instructed her in the use of OCPs and have given her literature reinforcing this information.  I have explained to the patient that OCPs are not as effective for birth control during the first month of use, and that another form of contraception should be used during this time.  Both first-day start and Sunday start have been explained.  The risks and benefits of each was discussed.  She has been made aware of  the fact that other medications may affect the efficacy of OCPs.  I have answered all of her questions, and I believe that she has an understanding of the effectiveness and use of OCPs. Patient to start OCPs today. Orders No orders of the defined types were placed in this encounter.    Meds ordered this encounter  Medications  . desogestrel-ethinyl estradiol (MIRCETTE) 0.15-0.02/0.01 MG (21/5) tablet    Sig: Take 1 tablet by mouth at bedtime.    Dispense:  1 Package    Refill:  2      F/U  Return for Pt to contact us if symptoms worsen. I spent 19 minutes involved in the care of this patient of which greater than 50% was spent discussing other forms of birth control, whether or not her ovarian cyst and her migraine headaches were related to IUD use review of literature regarding IUD and  hormones.  Review of literature regarding OCPs and migraine headaches.  All of her questions were answered.  Please see above for additional discussion  Finis Bud, M.D. 06/25/2019 5:22 PM

## 2019-09-13 ENCOUNTER — Other Ambulatory Visit: Payer: Self-pay | Admitting: Obstetrics and Gynecology

## 2019-09-13 DIAGNOSIS — Z30432 Encounter for removal of intrauterine contraceptive device: Secondary | ICD-10-CM

## 2019-09-13 DIAGNOSIS — Z3009 Encounter for other general counseling and advice on contraception: Secondary | ICD-10-CM

## 2019-09-14 NOTE — Telephone Encounter (Signed)
Please advise on refill. Patient has IUD

## 2019-10-12 ENCOUNTER — Other Ambulatory Visit: Payer: Self-pay

## 2019-10-12 ENCOUNTER — Ambulatory Visit (INDEPENDENT_AMBULATORY_CARE_PROVIDER_SITE_OTHER): Payer: BC Managed Care – PPO

## 2019-10-12 ENCOUNTER — Ambulatory Visit
Admission: EM | Admit: 2019-10-12 | Discharge: 2019-10-12 | Disposition: A | Discharge status: Home / Self Care | Payer: BC Managed Care – PPO | Attending: Family Medicine | Admitting: Family Medicine

## 2019-10-12 DIAGNOSIS — M25572 Pain in left ankle and joints of left foot: Secondary | ICD-10-CM

## 2019-10-12 DIAGNOSIS — S93402A Sprain of unspecified ligament of left ankle, initial encounter: Secondary | ICD-10-CM

## 2019-10-12 DIAGNOSIS — M79672 Pain in left foot: Secondary | ICD-10-CM

## 2019-10-12 HISTORY — DX: Depression, unspecified: F32.A

## 2019-10-12 NOTE — ED Provider Notes (Signed)
[facility] ____________________________________________  Time seen: Approximately 12:01 PM  I have reviewed the triage vital signs and the nursing notes.   HISTORY  Chief Complaint Ankle Pain  HPI Mandy Hughes is a 29 y.o. female presenting for evaluation of left ankle pain that occurred just prior to arrival.  Patient reports that she accidentally rolled her left ankle hearing a "pop "with associated pain.  Reports she has had pain since with weightbearing as well as swelling.  Denies other pain or injuries.  Reports otherwise doing well.  Denies alleviating factors.  Pain worse with activity.  Denies pain radiation.  Harlin Heys, MD: PCP Patient's last menstrual period was 09/21/2019.   Past Medical History:  Diagnosis Date  . Cancer ([place])    basal cell carcinoma  . Depression     There are no active problems to display for this patient.   Past Surgical History:  Procedure Laterality Date  . LIPOMA EXCISION Left 2017   shoulder  . SKIN CANCER EXCISION       No current facility-administered medications for this encounter.   Current Outpatient Medications:  .  busPIRone (BUSPAR) 5 MG tablet, Take by mouth., Disp: , Rfl:  .  traZODone (DESYREL) 50 MG tablet, Take by mouth., Disp: , Rfl:  .  levonorgestrel (MIRENA) 20 MCG/24HR IUD, 1 each by Intrauterine route once., Disp: , Rfl:  .  metroNIDAZOLE (FLAGYL) 500 MG tablet, Take two tablets by mouth twice a day, for one day.  Or you can take all four tablets at once if you can tolerate it., Disp: 4 tablet, Rfl: 0 .  VIORELE 0.15-0.02/0.01 MG (21/5) tablet, TAKE 1 TABLET BY MOUTH AT BEDTIME, Disp: 3 Package, Rfl: 1  Allergies Tape  Family History  Problem Relation Age of Onset  . Seizures Father   . Cancer Maternal Grandfather     Social History Social History   Tobacco Use  . Smoking status: Former Smoker    Quit date: 07/13/2017    Years since quitting: 2.2  . Smokeless tobacco:  Never Used  Substance Use Topics  . Alcohol use: Yes    Comment: rarely  . Drug use: No    Review of Systems Constitutional: No fever Cardiovascular: Denies chest pain. Respiratory: Denies shortness of breath. Gastrointestinal: No abdominal pain.  Musculoskeletal: Positive left ankle pain. Skin: Negative for rash.   ____________________________________________   PHYSICAL EXAM:  VITAL SIGNS: ED Triage Vitals  Enc Vitals Group     BP 10/12/19 1123 (!) 153/83     Pulse Rate 10/12/19 1123 (!) 130     Resp 10/12/19 1123 18     Temp 10/12/19 1123 98 F (36.7 C)     Temp Source 10/12/19 1123 Oral     SpO2 10/12/19 1123 100 %     Weight 10/12/19 1124 170 lb (77.1 kg)     Height 10/12/19 1124 5\' 3"  (1.6 m)     Head Circumference --      Peak Flow --      Pain Score 10/12/19 1124 8     Pain Loc --      Pain Edu? --      Excl. in [place]? --     Constitutional: Alert and oriented. Well appearing and in no acute distress. Eyes: Conjunctivae are normal.  ENT      Head: Normocephalic and atraumatic. Cardiovascular: Good peripheral circulation. Respiratory: Normal respiratory effort without tachypnea nor retractions.  Musculoskeletal:  Distal left pedal  pulses present and easily palpated.  Gait not tested due to pain. Except: Left proximal fifth metatarsal to lateral malleolus left ankle tenderness to direct palpation with mild localized swelling with tenderness along ATFL ligament, pain with rotation but good range of motion present, normal distal sensation present, left lower extremity otherwise nontender. Neurologic:  Normal speech and language.  Skin:  Skin is warm, dry and intact. No rash noted. Psychiatric: Mood and affect are normal. Speech and behavior are normal. Patient exhibits appropriate insight and judgment   ___________________________________________   LABS (all labs ordered are listed, but only abnormal results are displayed)  Labs Reviewed - No data to display  ____________________________________________  RADIOLOGY  Dg Ankle Complete Left  Result Date: 10/12/2019 CLINICAL DATA:  Pain post injury EXAM: LEFT ANKLE COMPLETE - 3+ VIEW COMPARISON:  None. FINDINGS: There is no evidence of fracture, dislocation, or joint effusion. There is no evidence of arthropathy or other focal bone abnormality. Soft tissues are unremarkable. IMPRESSION: Negative. Electronically Signed   By: Rolm Baptise M.D.   On: 10/12/2019 11:54   Dg Foot Complete Left  Result Date: 10/12/2019 CLINICAL DATA:  Injury today with fifth metatarsal pain. EXAM: LEFT FOOT - COMPLETE 3+ VIEW COMPARISON:  None. FINDINGS: There is no evidence of fracture or dislocation. There is no evidence of arthropathy or other focal bone abnormality. Soft tissues are unremarkable. IMPRESSION: Negative. Electronically Signed   By: Nelson Chimes M.D.   On: 10/12/2019 11:53   ____________________________________________   PROCEDURES Procedures    INITIAL IMPRESSION / ASSESSMENT AND PLAN / ED COURSE  Pertinent labs & imaging results that were available during my care of the patient were reviewed by me and considered in my medical decision making (see chart for details).  Well-appearing patient.  Left ankle pain post mechanical injury.  Left foot and left ankle x-rays as above, negative.  Suspect sprain injury.  ASO ankle brace given for support.  Has crutches.  Continue crutches for 3 days, wear brace, continue ice, rest, and gradual increase weightbearing as tolerated.  Encourage rest, ice, supportive care.  Follow-up with orthopedic as needed in 1 week for continued pain.  Discussed follow up and return parameters including no resolution or any worsening concerns. Patient verbalized understanding and agreed to plan.   ____________________________________________   FINAL CLINICAL IMPRESSION(S) / ED DIAGNOSES  Final diagnoses:  Sprain of left ankle, unspecified ligament, initial encounter  Acute  left ankle pain     ED Discharge Orders    None       Note: This dictation was prepared with Dragon dictation along with smaller phrase technology. Any transcriptional errors that result from this process are unintentional.         Marylene Land, NP 10/12/19 1307

## 2019-10-12 NOTE — ED Triage Notes (Signed)
Pt rolled her left ankle this a.m. Lateral pain and swelling. Pain 8/10. Heard a "pop"

## 2019-10-12 NOTE — Discharge Instructions (Addendum)
Rest. Ice.  Crutches for the next 3 days and brace as discussed.  Gradually increase weightbearing as tolerated.  Over-the-counter Tylenol ibuprofen as needed.  Follow-up with orthopedic as needed for continued pain.  Follow up with your primary care physician this week as needed. Return to Urgent care for new or worsening concerns.

## 2019-11-11 ENCOUNTER — Other Ambulatory Visit: Payer: Self-pay

## 2019-11-11 DIAGNOSIS — Z20822 Contact with and (suspected) exposure to covid-19: Secondary | ICD-10-CM

## 2019-11-14 LAB — NOVEL CORONAVIRUS, NAA: SARS-CoV-2, NAA: NOT DETECTED

## 2020-01-01 NOTE — L&D Delivery Note (Signed)
Delivery Summary for Mandy Hughes  Labor Events:   Preterm labor: No data found  Rupture date: 12/16/2020  Rupture time: 9:15 AM  Rupture type: Spontaneous  Fluid Color: No data found  Induction: No data found  Augmentation: No data found  Complications: No data found  Cervical ripening: No data found No data found   No data found     Delivery:   Episiotomy: No data found  Lacerations: No data found  Repair suture: No data found  Repair # of packets: No data found  Blood loss (ml): 700   Information for the patient's newborn:  Mandy, Hughes [[id number]]    Delivery 12/17/2020 9:51 PM by  Vaginal, Vacuum (Extractor) Sex:  female Gestational Age: [redacted]w[redacted]d Delivery Clinician:   Living?:         APGARS  One minute Five minutes Ten minutes  Skin color:        Heart rate:        Grimace:        Muscle tone:        Breathing:        Totals: 4  8      Presentation/position:      Resuscitation:   Cord information:    Disposition of cord blood:     Blood gases sent?  Complications:   Placenta: Delivered:       appearance Newborn Measurements: Weight: 8 lb 14.5 oz (4040 g)  Height:    Head circumference:    Chest circumference:    Other providers:    Additional  information: Forceps:   Vacuum:   Breech:   Observed anomalies scalp abrasions, blister        Operative Delivery Note Patient with epidural anesthesia in place had been pushing at 6:15 pm for approximately 2.5 hours.  She was beginning to note maternal exhaustion. Fetal station had changed from 0 to +2 station, but no further descent was noted after this. Patient was offered the option of use of Kiwi vacuum or to proceed with C-section.   Verbal consent: obtained from patient for vacuum assistance.  Risks and benefits discussed in detail.  Risks include, but are not limited to the risks of anesthesia, bleeding, infection, damage to maternal tissues, fetal cephalhematoma.  There is also the risk of inability  to effect vaginal delivery of the head, or shoulder dystocia that cannot be resolved by established maneuvers, leading to the need for emergency cesarean section. Patient agreed to use. At approximately 8:45 pm the Kiwi bell vacuum was placed.  Small amount of additional descent noted, however the vacuum was noted to be incorporating vaginal tissue despite several attempts to maneuver and so the vacuum was changed to a flat Kiwi.  With continued maternal pushing with moderate effort, the fetus was moved to +3 station (approximately 6 pushes). The vacuum was removed after 1 pop-off at 9:20 pm, and the patient was allowed to continue to push. At approximately 9:40 pm the flat vacuum was reapplied due to deep  variable decelerations with slow return to baseline of 150s. At 9:51 p.m, a healthy viable female was delivered.   APGAR: 4, 8; weight 8 lb 14.5 oz (4040 g).   Placenta status: spontaneously removed, intact.   Cord: 3-vessel with the following complications: None.  Cord pH: obtained, 7.02 (venous)  Anesthesia:  Epidural Instruments: Kiwi bell and flat vacuum.  Episiotomy: None Lacerations: Vaginal;1st degree Perineal Suture Repair: 2.0 vicryl Est. Blood Loss (mL): 700  Mom  to postpartum.  Baby to Couplet care / Skin to Skin.  Rubie Maid, MD 12/17/2020, 11:11 PM

## 2020-04-20 ENCOUNTER — Ambulatory Visit: Payer: BC Managed Care – PPO | Admitting: Obstetrics and Gynecology

## 2020-04-20 ENCOUNTER — Encounter: Payer: Self-pay | Admitting: Obstetrics and Gynecology

## 2020-04-20 ENCOUNTER — Other Ambulatory Visit: Payer: Self-pay

## 2020-04-20 VITALS — BP 145/87 | HR 84 | Ht 64.0 in | Wt 202.1 lb

## 2020-04-20 DIAGNOSIS — N912 Amenorrhea, unspecified: Secondary | ICD-10-CM | POA: Diagnosis not present

## 2020-04-20 NOTE — Progress Notes (Signed)
HPI:      Mandy Hughes is a 30 y.o. G1P0000 who LMP was Patient's last menstrual period was 03/14/2020.  Subjective:   She presents today for pregnancy confirmation.  She is approximately 5 weeks estimated gestational age based on last menstrual period.  She has occasional nausea but without vomiting.  She has some mild pelvic discomfort but no significant pain or cramping. She is taking prenatal vitamins. She was not specifically trying to get pregnant but she stopped her IUD and OCPs and knew that pregnancy was a distinct possibility.    Hx: The following portions of the patient's history were reviewed and updated as appropriate:             She  has a past medical history of Cancer (Ramirez-Perez) and Depression. She does not have a problem list on file. She  has a past surgical history that includes Lipoma excision (Left, 2017) and Skin cancer excision. Her family history includes Cancer in her maternal grandfather; Seizures in her father. She  reports that she quit smoking about 2 years ago. She has never used smokeless tobacco. She reports current alcohol use. She reports that she does not use drugs. She has a current medication list which includes the following prescription(s): buspirone and trazodone. She is allergic to latex and tape.       Review of Systems:  Review of Systems  Constitutional: Denied constitutional symptoms, night sweats, recent illness, fatigue, fever, insomnia and weight loss.  Eyes: Denied eye symptoms, eye pain, photophobia, vision change and visual disturbance.  Ears/Nose/Throat/Neck: Denied ear, nose, throat or neck symptoms, hearing loss, nasal discharge, sinus congestion and sore throat.  Cardiovascular: Denied cardiovascular symptoms, arrhythmia, chest pain/pressure, edema, exercise intolerance, orthopnea and palpitations.  Respiratory: Denied pulmonary symptoms, asthma, pleuritic pain, productive sputum, cough, dyspnea and wheezing.  Gastrointestinal:  Denied, gastro-esophageal reflux, melena, nausea and vomiting.  Genitourinary: Denied genitourinary symptoms including symptomatic vaginal discharge, pelvic relaxation issues, and urinary complaints.  Musculoskeletal: Denied musculoskeletal symptoms, stiffness, swelling, muscle weakness and myalgia.  Dermatologic: Denied dermatology symptoms, rash and scar.  Neurologic: Denied neurology symptoms, dizziness, headache, neck pain and syncope.  Psychiatric: Denied psychiatric symptoms, anxiety and depression.  Endocrine: Denied endocrine symptoms including hot flashes and night sweats.   Meds:   Current Outpatient Medications on File Prior to Visit  Medication Sig Dispense Refill  . busPIRone (BUSPAR) 5 MG tablet Take by mouth.    . traZODone (DESYREL) 50 MG tablet Take by mouth.     No current facility-administered medications on file prior to visit.    Objective:     Vitals:   04/20/20 0844  BP: (!) 145/87  Pulse: 84              Urinary pregnancy test positive  Assessment:    G1P0000 There are no problems to display for this patient.    1. Amenorrhea     Approximately 5 weeks estimated gestational age.   Plan:            Prenatal Plan 1.  The patient was given prenatal literature. 2.  She was continued on prenatal vitamins. 3.  A prenatal lab panel was ordered or drawn. 4.  An ultrasound was ordered to better determine an EDC. 5.  A nurse visit was scheduled. 6.  Genetic testing and testing for other inheritable conditions discussed in detail. She will decide in the future whether to have these labs performed. 7.  A general overview of  pregnancy testing, visit schedule, ultrasound schedule, and prenatal care was discussed. 8.  COVID and its risks associated with pregnancy, prevention by limiting exposure and use of masks, as well as the risks and benefits of vaccination during pregnancy were discussed in detail.  [facility] policy regarding office and hospital visitation  and testing was explained. 9.  Benefits of breast-feeding discussed in detail including both maternal and infant benefits. Ready Set Baby website discussed.   Orders Orders Placed This Encounter  Procedures  . US OB Comp Less 14 Wks  . US OB Transvaginal    No orders of the defined types were placed in this encounter.     F/U  Return for [place] 1 week - Nurse visit 4 weeks -  NOB 7 weeks. I spent 22 minutes involved in the care of this patient preparing to see the patient by obtaining and reviewing her medical history (including labs, imaging tests and prior procedures), documenting clinical information in the electronic health record (EHR), counseling and coordinating care plans, writing and sending prescriptions, ordering tests or procedures and directly communicating with the patient by discussing pertinent items from her history and physical exam as well as detailing my assessment and plan as noted above so that she has an informed understanding.  All of her questions were answered.  Finis Bud, M.D. 04/20/2020 9:12 AM

## 2020-04-28 ENCOUNTER — Ambulatory Visit (INDEPENDENT_AMBULATORY_CARE_PROVIDER_SITE_OTHER): Payer: BC Managed Care – PPO

## 2020-04-28 ENCOUNTER — Other Ambulatory Visit: Payer: Self-pay

## 2020-04-28 DIAGNOSIS — Z3A01 Less than 8 weeks gestation of pregnancy: Secondary | ICD-10-CM

## 2020-04-28 DIAGNOSIS — N912 Amenorrhea, unspecified: Secondary | ICD-10-CM | POA: Diagnosis not present

## 2020-05-09 ENCOUNTER — Telehealth: Payer: Self-pay | Admitting: Obstetrics and Gynecology

## 2020-05-09 MED ORDER — DOXYLAMINE-PYRIDOXINE 10-10 MG PO TBEC: 2.0000 | DELAYED_RELEASE_TABLET | Freq: Every day | ORAL | 5 refills | Status: DC

## 2020-05-09 NOTE — Telephone Encounter (Signed)
Patient called in asking if her provider could call her in her prescription for nausea. Patient said she has tried everything- diet, OTC medication and nothing is helping. Could you please advise?

## 2020-05-09 NOTE — Telephone Encounter (Signed)
I have sent Diclegis to the pharmacy. Notified patient that prescription has been sent in. If the insurance does not cover she will call back.

## 2020-05-19 ENCOUNTER — Other Ambulatory Visit: Payer: Self-pay

## 2020-05-19 ENCOUNTER — Ambulatory Visit (INDEPENDENT_AMBULATORY_CARE_PROVIDER_SITE_OTHER): Payer: BC Managed Care – PPO

## 2020-05-19 VITALS — BP 112/73 | HR 83 | Ht 64.0 in | Wt 206.8 lb

## 2020-05-19 DIAGNOSIS — R638 Other symptoms and signs concerning food and fluid intake: Secondary | ICD-10-CM | POA: Diagnosis not present

## 2020-05-19 DIAGNOSIS — Z113 Encounter for screening for infections with a predominantly sexual mode of transmission: Secondary | ICD-10-CM

## 2020-05-19 DIAGNOSIS — Z3401 Encounter for supervision of normal first pregnancy, first trimester: Secondary | ICD-10-CM | POA: Diagnosis not present

## 2020-05-19 DIAGNOSIS — Z0283 Encounter for blood-alcohol and blood-drug test: Secondary | ICD-10-CM | POA: Diagnosis not present

## 2020-05-19 DIAGNOSIS — Z3A08 8 weeks gestation of pregnancy: Secondary | ICD-10-CM

## 2020-05-19 NOTE — Progress Notes (Signed)
      Mikaiya Marcile Omura presents for NOB nurse intake visit. Pregnancy confirmation done at [facility] 04/20/2020 with Harlin Heys, MD.  [place].  P0.  LMP 03/14/2020.  EDD 12/24/2020.  [place] [redacted]w[redacted]d. Pregnancy education material/ explained and given. 1  cats in the home.  NOB labs ordered. BMI greater than 30. TSH/HbgA1c ordered. Sickle cell not ordered due to race. HIV and drug screen explained and ordered/declined. Genetic screening discussed. Genetic testing; Unsure. Pt to discuss genetic testing with provider. PNV encouraged. Pt to follow up with provider in  weeks for NOB physical.  [place] completed and reviewed with pt. FMLA form signed and completed by pt.

## 2020-05-19 NOTE — Patient Instructions (Signed)
How a Baby Grows During Pregnancy  Pregnancy begins when a female's sperm enters a female's egg (fertilization). Fertilization usually happens in one of the tubes (fallopian tubes) that connect the ovaries to the womb (uterus). The fertilized egg moves down the fallopian tube to the uterus. Once it reaches the uterus, it implants into the lining of the uterus and begins to grow. For the first 10 weeks, the fertilized egg is called an embryo. After 10 weeks, it is called a fetus. As the fetus continues to grow, it receives oxygen and nutrients through tissue (placenta) that grows to support the developing baby. The placenta is the life support system for the baby. It provides oxygen and nutrition and removes waste. Learning as much as you can about your pregnancy and how your baby is developing can help you enjoy the experience. It can also make you aware of when there might be a problem and when to ask questions. How long does a typical pregnancy last? A pregnancy usually lasts 280 days, or about 40 weeks. Pregnancy is divided into three periods of growth, also called trimesters:  First trimester: 0-12 weeks.  Second trimester: 13-27 weeks.  Third trimester: 28-40 weeks. The day when your baby is ready to be born (full term) is your estimated date of delivery. How does my baby develop month by month? First month  The fertilized egg attaches to the inside of the uterus.  Some cells will form the placenta. Others will form the fetus.  The arms, legs, brain, spinal cord, lungs, and heart begin to develop.  At the end of the first month, the heart begins to beat. Second month  The bones, inner ear, eyelids, hands, and feet form.  The genitals develop.  By the end of 8 weeks, all major organs are developing. Third month  All of the internal organs are forming.  Teeth develop below the gums.  Bones and muscles begin to grow. The spine can flex.  The skin is transparent.  Fingernails  and toenails begin to form.  Arms and legs continue to grow longer, and hands and feet develop.  The fetus is about 3 inches (7.6 cm) long. Fourth month  The placenta is completely formed.  The external sex organs, neck, outer ear, eyebrows, eyelids, and fingernails are formed.  The fetus can hear, swallow, and move its arms and legs.  The kidneys begin to produce urine.  The skin is covered with a white, waxy coating (vernix) and very fine hair (lanugo). Fifth month  The fetus moves around more and can be felt for the first time (quickening).  The fetus starts to sleep and wake up and may begin to suck its finger.  The nails grow to the end of the fingers.  The organ in the digestive system that makes bile (gallbladder) functions and helps to digest nutrients.  If your baby is a girl, eggs are present in her ovaries. If your baby is a boy, testicles start to move down into his scrotum. Sixth month  The lungs are formed.  The eyes open. The brain continues to develop.  Your baby has fingerprints and toe prints. Your baby's hair grows thicker.  At the end of the second trimester, the fetus is about 9 inches (22.9 cm) long. Seventh month  The fetus kicks and stretches.  The eyes are developed enough to sense changes in light.  The hands can make a grasping motion.  The fetus responds to sound. Eighth month  All  organs and body systems are fully developed and functioning.  Bones harden, and taste buds develop. The fetus may hiccup.  Certain areas of the brain are still developing. The skull remains soft. Ninth month  The fetus gains about  lb (0.23 kg) each week.  The lungs are fully developed.  Patterns of sleep develop.  The fetus's head typically moves into a head-down position (vertex) in the uterus to prepare for birth.  The fetus weighs 6-9 lb (2.72-4.08 kg) and is 19-20 inches (48.26-50.8 cm) long. What can I do to have a healthy pregnancy and help  my baby develop? General instructions  Take prenatal vitamins as directed by your health care provider. These include vitamins such as folic acid, iron, calcium, and vitamin D. They are important for healthy development.  Take medicines only as directed by your health care provider. Read labels and ask a pharmacist or your health care provider whether over-the-counter medicines, supplements, and prescription drugs are safe to take during pregnancy.  Keep all follow-up visits as directed by your health care provider. This is important. Follow-up visits include prenatal care and screening tests. How do I know if my baby is developing well? At each prenatal visit, your health care provider will do several different tests to check on your health and keep track of your baby's development. These include:  Fundal height and position. ? Your health care provider will measure your growing belly from your pubic bone to the top of the uterus using a tape measure. ? Your health care provider will also feel your belly to determine your baby's position.  Heartbeat. ? An ultrasound in the first trimester can confirm pregnancy and show a heartbeat, depending on how far along you are. ? Your health care provider will check your baby's heart rate at every prenatal visit.  Second trimester ultrasound. ? This ultrasound checks your baby's development. It also may show your baby's gender. What should I do if I have concerns about my baby's development? Always talk with your health care provider about any concerns that you may have about your pregnancy and your baby. Summary  A pregnancy usually lasts 280 days, or about 40 weeks. Pregnancy is divided into three periods of growth, also called trimesters.  Your health care provider will monitor your baby's growth and development throughout your pregnancy.  Follow your health care provider's recommendations about taking prenatal vitamins and medicines during  your pregnancy.  Talk with your health care provider if you have any concerns about your pregnancy or your developing baby. This information is not intended to replace advice given to you by your health care provider. Make sure you discuss any questions you have with your health care provider. Document Revised: 04/09/2019 Document Reviewed: 10/30/2017 Elsevier Patient Education  2020 [place] of Pregnancy  The first trimester of pregnancy is from week 1 until the end of week 13 (months 1 through 3). During this time, your baby will begin to develop inside you. At 6-8 weeks, the eyes and face are formed, and the heartbeat can be seen on ultrasound. At the end of 12 weeks, all the baby's organs are formed. Prenatal care is all the medical care you receive before the birth of your baby. Make sure you get good prenatal care and follow all of your doctor's instructions. Follow these instructions at home: Medicines  Take over-the-counter and prescription medicines only as told by your doctor. Some medicines are safe and some medicines are not safe  during pregnancy.  Take a prenatal vitamin that contains at least 600 micrograms (mcg) of folic acid.  If you have trouble pooping (constipation), take medicine that will make your stool soft (stool softener) if your doctor approves. Eating and drinking   Eat regular, healthy meals.  Your doctor will tell you the amount of weight gain that is right for you.  Avoid raw meat and uncooked cheese.  If you feel sick to your stomach (nauseous) or throw up (vomit): ? Eat 4 or 5 small meals a day instead of 3 large meals. ? Try eating a few soda crackers. ? Drink liquids between meals instead of during meals.  To prevent constipation: ? Eat foods that are high in fiber, like fresh fruits and vegetables, whole grains, and beans. ? Drink enough fluids to keep your pee (urine) clear or pale yellow. Activity  Exercise only as told by  your doctor. Stop exercising if you have cramps or pain in your lower belly (abdomen) or low back.  Do not exercise if it is too hot, too humid, or if you are in a place of great height (high altitude).  Try to avoid standing for long periods of time. Move your legs often if you must stand in one place for a long time.  Avoid heavy lifting.  Wear low-heeled shoes. Sit and stand up straight.  You can have sex unless your doctor tells you not to. Relieving pain and discomfort  Wear a good support bra if your breasts are sore.  Take warm water baths (sitz baths) to soothe pain or discomfort caused by hemorrhoids. Use hemorrhoid cream if your doctor says it is okay.  Rest with your legs raised if you have leg cramps or low back pain.  If you have puffy, bulging veins (varicose veins) in your legs: ? Wear support hose or compression stockings as told by your doctor. ? Raise (elevate) your feet for 15 minutes, 3-4 times a day. ? Limit salt in your food. Prenatal care  Schedule your prenatal visits by the twelfth week of pregnancy.  Write down your questions. Take them to your prenatal visits.  Keep all your prenatal visits as told by your doctor. This is important. Safety  Wear your seat belt at all times when driving.  Make a list of emergency phone numbers. The list should include numbers for family, friends, the hospital, and police and fire departments. General instructions  Ask your doctor for a referral to a local prenatal class. Begin classes no later than at the start of month 6 of your pregnancy.  Ask for help if you need counseling or if you need help with nutrition. Your doctor can give you advice or tell you where to go for help.  Do not use hot tubs, steam rooms, or saunas.  Do not douche or use tampons or scented sanitary pads.  Do not cross your legs for long periods of time.  Avoid all herbs and alcohol. Avoid drugs that are not approved by your doctor.  Do  not use any tobacco products, including cigarettes, chewing tobacco, and electronic cigarettes. If you need help quitting, ask your doctor. You may get counseling or other support to help you quit.  Avoid cat litter boxes and soil used by cats. These carry germs that can cause birth defects in the baby and can cause a loss of your baby (miscarriage) or stillbirth.  Visit your dentist. At home, brush your teeth with a soft toothbrush. Be gentle  when you floss. Contact a doctor if:  You are dizzy.  You have mild cramps or pressure in your lower belly.  You have a nagging pain in your belly area.  You continue to feel sick to your stomach, you throw up, or you have watery poop (diarrhea).  You have a bad smelling fluid coming from your vagina.  You have pain when you pee (urinate).  You have increased puffiness (swelling) in your face, hands, legs, or ankles. Get help right away if:  You have a fever.  You are leaking fluid from your vagina.  You have spotting or bleeding from your vagina.  You have very bad belly cramping or pain.  You gain or lose weight rapidly.  You throw up blood. It may look like coffee grounds.  You are around people who have [place] measles, fifth disease, or chickenpox.  You have a very bad headache.  You have shortness of breath.  You have any kind of trauma, such as from a fall or a car accident. Summary  The first trimester of pregnancy is from week 1 until the end of week 13 (months 1 through 3).  To take care of yourself and your unborn baby, you will need to eat healthy meals, take medicines only if your doctor tells you to do so, and do activities that are safe for you and your baby.  Keep all follow-up visits as told by your doctor. This is important as your doctor will have to ensure that your baby is healthy and growing well. This information is not intended to replace advice given to you by your health care provider. Make sure you  discuss any questions you have with your health care provider. Document Revised: 04/09/2019 Document Reviewed: 12/25/2016 Elsevier Patient Education  2020 [employer]. Commonly Asked Questions During Pregnancy  Cats: A parasite can be excreted in cat feces.  To avoid exposure you need to have another person empty the little box.  If you must empty the litter box you will need to wear gloves.  Wash your hands after handling your cat.  This parasite can also be found in raw or undercooked meat so this should also be avoided.  Colds, Sore Throats, Flu: Please check your medication sheet to see what you can take for symptoms.  If your symptoms are unrelieved by these medications please call the office.  Dental Work: Most any dental work Investment banker, corporate recommends is permitted.  X-rays should only be taken during the first trimester if absolutely necessary.  Your abdomen should be shielded with a lead apron during all x-rays.  Please notify your provider prior to receiving any x-rays.  Novocaine is fine; gas is not recommended.  If your dentist requires a note from [place] prior to dental work please call the office and we will provide one for you.  Exercise: Exercise is an important part of staying healthy during your pregnancy.  You may continue most exercises you were accustomed to prior to pregnancy.  Later in your pregnancy you will most likely notice you have difficulty with activities requiring balance like riding a bicycle.  It is important that you listen to your body and avoid activities that put you at a higher risk of falling.  Adequate rest and staying well hydrated are a must!  If you have questions about the safety of specific activities ask your provider.    Exposure to Children with illness: Try to avoid obvious exposure; report any symptoms to [place]  when noted,  If you have chicken pos, red measles or mumps, you should be immune to these diseases.   Please do not take any vaccines while pregnant  unless you have checked with your OB provider.  Fetal Movement: After 28 weeks we recommend you do "kick counts" twice daily.  Lie or sit down in a calm quiet environment and count your baby movements "kicks".  You should feel your baby at least 10 times per hour.  If you have not felt 10 kicks within the first hour get up, walk around and have something sweet to eat or drink then repeat for an additional hour.  If count remains less than 10 per hour notify your provider.  Fumigating: Follow your pest control agent's advice as to how long to stay out of your home.  Ventilate the area well before re-entering.  Hemorrhoids:   Most over-the-counter preparations can be used during pregnancy.  Check your medication to see what is safe to use.  It is important to use a stool softener or fiber in your diet and to drink lots of liquids.  If hemorrhoids seem to be getting worse please call the office.   Hot Tubs:  Hot tubs Jacuzzis and saunas are not recommended while pregnant.  These increase your internal body temperature and should be avoided.  Intercourse:  Sexual intercourse is safe during pregnancy as long as you are comfortable, unless otherwise advised by your provider.  Spotting may occur after intercourse; report any bright red bleeding that is heavier than spotting.  Labor:  If you know that you are in labor, please go to the hospital.  If you are unsure, please call the office and let us help you decide what to do.  Lifting, straining, etc:  If your job requires heavy lifting or straining please check with your provider for any limitations.  Generally, you should not lift items heavier than that you can lift simply with your hands and arms (no back muscles)  Painting:  Paint fumes do not harm your pregnancy, but may make you ill and should be avoided if possible.  Latex or water based paints have less odor than oils.  Use adequate ventilation while painting.  Permanents & Hair Color:  Chemicals  in hair dyes are not recommended as they cause increase hair dryness which can increase hair loss during pregnancy.  " Highlighting" and permanents are allowed.  Dye may be absorbed differently and permanents may not hold as well during pregnancy.  Sunbathing:  Use a sunscreen, as skin burns easily during pregnancy.  Drink plenty of fluids; avoid over heating.  Tanning Beds:  Because their possible side effects are still unknown, tanning beds are not recommended.  Ultrasound Scans:  Routine ultrasounds are performed at approximately 20 weeks.  You will be able to see your baby's general anatomy an if you would like to know the gender this can usually be determined as well.  If it is questionable when you conceived you may also receive an ultrasound early in your pregnancy for dating purposes.  Otherwise ultrasound exams are not routinely performed unless there is a medical necessity.  Although you can request a scan we ask that you pay for it when conducted because insurance does not cover " patient request" scans.  Work: If your pregnancy proceeds without complications you may work until your due date, unless your physician or employer advises otherwise.  Round Ligament Pain/Pelvic Discomfort:  Sharp, shooting pains not associated with bleeding  are fairly common, usually occurring in the second trimester of pregnancy.  They tend to be worse when standing up or when you remain standing for long periods of time.  These are the result of pressure of certain pelvic ligaments called "round ligaments".  Rest, Tylenol and heat seem to be the most effective relief.  As the womb and fetus grow, they rise out of the pelvis and the discomfort improves.  Please notify the office if your pain seems different than that described.  It may represent a more serious condition.  Common Medications Safe in Pregnancy  Acne:      Constipation:  Benzoyl Peroxide     Colace  Clindamycin      Dulcolax Suppository  Topica  Erythromycin     Fibercon  Salicylic Acid      Metamucil         Miralax AVOID:        Senakot   Accutane    Cough:  Retin-A       Cough Drops  Tetracycline      Phenergan w/ Codeine if Rx  Minocycline      Robitussin (Plain & DM)  Antibiotics:     Crabs/Lice:  Ceclor       RID  Cephalosporins    AVOID:  E-Mycins      Kwell  Keflex  Macrobid/Macrodantin   Diarrhea:  Penicillin      Kao-Pectate  Zithromax      Imodium AD         PUSH FLUIDS AVOID:       Cipro     Fever:  Tetracycline      Tylenol (Regular or Extra  Minocycline       Strength)  Levaquin      Extra Strength-Do not          Exceed 8 tabs/24 hrs Caffeine:        <241m/day (equiv. To 1 cup of coffee or  approx. 3 12 oz sodas)         Gas: Cold/Hayfever:       Gas-X  Benadryl      Mylicon  Claritin       Phazyme  **Claritin-D        Chlor-Trimeton    Headaches:  Dimetapp      ASA-Free Excedrin  Drixoral-Non-Drowsy     Cold Compress  Mucinex (Guaifenasin)     Tylenol (Regular or Extra  Sudafed/Sudafed-12 Hour     Strength)  **Sudafed PE Pseudoephedrine   Tylenol Cold & Sinus     Vicks Vapor Rub  Zyrtec  **AVOID if Problems With Blood Pressure         Heartburn: Avoid lying down for at least 1 hour after meals  Aciphex      Maalox     Rash:  Milk of Magnesia     Benadryl    Mylanta       1% Hydrocortisone Cream  Pepcid  Pepcid Complete   Sleep Aids:  Prevacid      Ambien   Prilosec       Benadryl  Rolaids       Chamomile Tea  Tums (Limit 4/day)     Unisom  Zantac       Tylenol PM         Warm milk-add vanilla or  Hemorrhoids:       Sugar for taste  Anusol/Anusol H.C.  (RX: Analapram 2.5%)  Sugar Substitutes:  Hydrocortisone OTC  Ok in moderation  Preparation H      Tucks        Vaseline lotion applied to tissue with wiping    Herpes:     Throat:  Acyclovir      Oragel  Famvir  Valtrex     Vaccines:         Flu Shot Leg Cramps:       *Gardasil  Benadryl      Hepatitis  A         Hepatitis B Nasal Spray:       Pneumovax  Saline Nasal Spray     Polio Booster         Tetanus Nausea:       Tuberculosis test or PPD  Vitamin B6 25 mg TID   AVOID:    Dramamine      *Gardasil  Emetrol       Live Poliovirus  Ginger Root 250 mg QID    MMR (measles, mumps &  High Complex Carbs @ Bedtime    rebella)  Sea Bands-Accupressure    Varicella (Chickenpox)  Unisom 1/2 tab TID     *No known complications           If received before Pain:         Known pregnancy;   Darvocet       Resume series after  Lortab        Delivery  Percocet    Yeast:   Tramadol      Femstat  Tylenol 3      Gyne-lotrimin  Ultram       Monistat  Vicodin           MISC:         All Sunscreens           Hair Coloring/highlights          Insect Repellant's          (Including DEET)         Mystic Tans

## 2020-05-20 LAB — VARICELLA ZOSTER ANTIBODY, IGG: Varicella zoster IgG: 1801 index (ref >165)

## 2020-05-20 LAB — NICOTINE SCREEN, URINE: Cotinine Ql Scrn, Ur: NEGATIVE ng/mL

## 2020-05-20 LAB — DRUG PROFILE, UR, 9 DRUGS (LABCORP)
Amphetamines, Urine: NEGATIVE ng/mL
Barbiturate Quant, Ur: NEGATIVE ng/mL
Benzodiazepine Quant, Ur: NEGATIVE ng/mL
Cannabinoid Quant, Ur: NEGATIVE ng/mL
Cocaine (Metab.): NEGATIVE ng/mL
Methadone Screen, Urine: NEGATIVE ng/mL
Opiate Quant, Ur: NEGATIVE ng/mL
PCP Quant, Ur: NEGATIVE ng/mL
Propoxyphene: NEGATIVE ng/mL

## 2020-05-20 LAB — URINALYSIS, ROUTINE W REFLEX MICROSCOPIC
Bilirubin, UA: NEGATIVE
Glucose, UA: NEGATIVE
Ketones, UA: NEGATIVE
Leukocytes,UA: NEGATIVE
Nitrite, UA: NEGATIVE
Protein,UA: NEGATIVE
RBC, UA: NEGATIVE
Specific Gravity, UA: 1.018 (ref 1.005–1.030)
Urobilinogen, Ur: 0.2 mg/dL (ref 0.2–1.0)
pH, UA: 8 — ABNORMAL HIGH (ref 5.0–7.5)

## 2020-05-20 LAB — ABO AND RH: Rh Factor: NEGATIVE

## 2020-05-20 LAB — HEMOGLOBIN A1C
Est. average glucose Bld gHb Est-mCnc: 88 mg/dL
Hgb A1c MFr Bld: 4.7 % — ABNORMAL LOW (ref 4.8–5.6)

## 2020-05-20 LAB — TOXOPLASMA ANTIBODIES- IGG AND  IGM
Toxoplasma Antibody- IgM: <3 AU/mL (ref 0.0–7.9)
Toxoplasma IgG Ratio: <3 IU/mL (ref 0.0–7.1)

## 2020-05-20 LAB — RPR: RPR Ser Ql: NONREACTIVE

## 2020-05-20 LAB — HEPATITIS B SURFACE ANTIGEN: Hepatitis B Surface Ag: NEGATIVE

## 2020-05-20 LAB — TSH: TSH: 1.53 u[IU]/mL (ref 0.450–4.500)

## 2020-05-20 LAB — RUBELLA SCREEN: Rubella Antibodies, IGG: 3.36 index (ref >0.99)

## 2020-05-20 LAB — HIV ANTIBODY (ROUTINE TESTING W REFLEX): HIV Screen 4th Generation wRfx: NONREACTIVE

## 2020-05-20 LAB — ANTIBODY SCREEN: Antibody Screen: NEGATIVE

## 2020-05-21 LAB — URINE CULTURE, OB REFLEX: Organism ID, Bacteria: NO GROWTH

## 2020-05-21 LAB — GC/CHLAMYDIA PROBE AMP
Chlamydia trachomatis, NAA: NEGATIVE
Neisseria Gonorrhoeae by PCR: NEGATIVE

## 2020-05-21 LAB — CULTURE, OB URINE

## 2020-05-23 ENCOUNTER — Telehealth: Payer: Self-pay | Admitting: Obstetrics and Gynecology

## 2020-05-23 NOTE — Telephone Encounter (Signed)
Patient called in saying she is having severe lower pelvic pain as well as lower back pain causing the patient to be very uncomfortable. Patient denies any vagina bleeding or discharge. Could you please advise?

## 2020-05-23 NOTE — Telephone Encounter (Signed)
Spoke with patient and she stated that she is having cramps like she is getting ready to start. She has not taken anything to help with this pain. I told her that it is safe to take tylenol for pain. She will try this. I also told her that she can try benadryl to help her rest at night. Patient is going to try this and if they do not help will call back in the morning to be seen.

## 2020-05-24 ENCOUNTER — Encounter: Payer: Self-pay | Admitting: Obstetrics and Gynecology

## 2020-05-24 ENCOUNTER — Other Ambulatory Visit: Payer: Self-pay

## 2020-05-24 ENCOUNTER — Ambulatory Visit (INDEPENDENT_AMBULATORY_CARE_PROVIDER_SITE_OTHER): Payer: BC Managed Care – PPO | Admitting: Obstetrics and Gynecology

## 2020-05-24 ENCOUNTER — Ambulatory Visit (INDEPENDENT_AMBULATORY_CARE_PROVIDER_SITE_OTHER): Payer: BC Managed Care – PPO

## 2020-05-24 VITALS — BP 105/67 | HR 87 | Wt 205.1 lb

## 2020-05-24 DIAGNOSIS — Z3A09 9 weeks gestation of pregnancy: Secondary | ICD-10-CM | POA: Diagnosis not present

## 2020-05-24 DIAGNOSIS — N912 Amenorrhea, unspecified: Secondary | ICD-10-CM | POA: Diagnosis not present

## 2020-05-24 DIAGNOSIS — R102 Pelvic and perineal pain: Secondary | ICD-10-CM

## 2020-05-24 LAB — POCT URINALYSIS DIPSTICK OB
Bilirubin, UA: NEGATIVE
Glucose, UA: NEGATIVE
Ketones, UA: NEGATIVE
Leukocytes, UA: NEGATIVE
Nitrite, UA: NEGATIVE
POC,PROTEIN,UA: NEGATIVE
Spec Grav, UA: 1.01 (ref 1.010–1.025)
Urobilinogen, UA: 0.2 E.U./dL
pH, UA: 7 (ref 5.0–8.0)

## 2020-05-24 NOTE — Progress Notes (Signed)
HPI:      [name] is a 30 y.o. G1P0000 who LMP was Patient's last menstrual period was 03/14/2020.  Subjective:   She presents today complaining of on and off pelvic and back pain since Sunday.  Also complains of daily nausea with limited vomiting.  She reports she is able to take liquids and occasionally eat meals but basically feels nauseated 100% of the time.  Denies vaginal bleeding denies changes in bowel or bladder habits. She is emotionally drained and upset from intermittent pain and not sleeping well. Of significant note a recent urine culture is negative.     Hx: The following portions of the patient's history were reviewed and updated as appropriate:             She  has a past medical history of Cancer ([place]) and Depression. She does not have a problem list on file. She  has a past surgical history that includes Lipoma excision (Left, 2017) and Skin cancer excision. Her family history includes Cancer in her maternal grandfather; Seizures in her father. She  reports that she quit smoking about 2 years ago. She has never used smokeless tobacco. She reports previous alcohol use. She reports that she does not use drugs. She has a current medication list which includes the following prescription(s): buspirone, doxylamine-pyridoxine, prenatal mv & min w/fa-dha, and trazodone. She is allergic to latex and tape.       Review of Systems:  Review of Systems  Constitutional: Denied constitutional symptoms, night sweats, recent illness, fatigue, fever, insomnia and weight loss.  Eyes: Denied eye symptoms, eye pain, photophobia, vision change and visual disturbance.  Ears/Nose/Throat/Neck: Denied ear, nose, throat or neck symptoms, hearing loss, nasal discharge, sinus congestion and sore throat.  Cardiovascular: Denied cardiovascular symptoms, arrhythmia, chest pain/pressure, edema, exercise intolerance, orthopnea and palpitations.  Respiratory: Denied pulmonary symptoms,  asthma, pleuritic pain, productive sputum, cough, dyspnea and wheezing.  Gastrointestinal: Denied, gastro-esophageal reflux, melena, nausea and vomiting.  Genitourinary: Denied genitourinary symptoms including symptomatic vaginal discharge, pelvic relaxation issues, and urinary complaints.  Musculoskeletal: Denied musculoskeletal symptoms, stiffness, swelling, muscle weakness and myalgia.  Dermatologic: Denied dermatology symptoms, rash and scar.  Neurologic: Denied neurology symptoms, dizziness, headache, neck pain and syncope.  Psychiatric: Denied psychiatric symptoms, anxiety and depression.  Endocrine: Denied endocrine symptoms including hot flashes and night sweats.   Meds:   Current Outpatient Medications on File Prior to Visit  Medication Sig Dispense Refill  . busPIRone (BUSPAR) 5 MG tablet Take by mouth. As needed have not taken since pregnancy    . Doxylamine-Pyridoxine (DICLEGIS) 10-10 MG TBEC Take 2 tablets by mouth at bedtime. If symptoms persist, add one tablet in the morning and one in the afternoon 100 tablet 5  . Prenatal MV & Min w/FA-DHA (PRENATAL ADULT GUMMY/DHA/FA PO) Take by mouth.    . traZODone (DESYREL) 50 MG tablet Take by mouth. As needed have not taken since pregnancy     No current facility-administered medications on file prior to visit.    Objective:     Vitals:   05/24/20 1107  BP: 105/67  Pulse: 87                Assessment:    G1P0000 There are no problems to display for this patient.    1. Pelvic pain     Patient experiencing intermittent pelvic pain possibly from uterine growth.  Possibly from corpus luteum cyst.  Strongly doubt UTI based on recent urine culture.  No evidence of appendicitis. Likely not miscarriage based on pain not crampy and no vaginal bleeding.   Plan:            1.  Ultrasound today for reassurance and because previous fetal heart rate was 80.  2.  Nausea and vomiting of pregnancy discussed in detail.  Patient  failed trial of Diclegis.  Awaiting B6 use.  Frequent small meals use of liquids etc. Discussed.  Resolution of most nausea during pregnancy approximately 13 weeks discussed.  3.  Reassurance given regarding intermittent pelvic pain during pregnancy especially in the first trimester as the uterus grows. Orders Orders Placed This Encounter  Procedures  . POC Urinalysis Dipstick OB    No orders of the defined types were placed in this encounter.     F/U  No follow-ups on file.  Finis Bud, M.D. 05/24/2020 11:52 AM

## 2020-05-24 NOTE — Telephone Encounter (Signed)
Patient is still having pains. I have scheduled her to come in to see Dr. Amalia Hailey.

## 2020-06-08 ENCOUNTER — Encounter: Payer: BC Managed Care – PPO | Admitting: Obstetrics and Gynecology

## 2020-06-14 ENCOUNTER — Encounter: Payer: BC Managed Care – PPO | Admitting: Obstetrics and Gynecology

## 2020-06-14 ENCOUNTER — Telehealth: Payer: Self-pay | Admitting: Obstetrics and Gynecology

## 2020-06-14 NOTE — Telephone Encounter (Signed)
Pt called in and stated  Her Dermatologist wanted her to reach out to her OB to make sure if it was ok to take Clindamycin. The pt is requesting a call back. Please advise

## 2020-06-14 NOTE — Telephone Encounter (Signed)
Pt aware clindamycin is safe in pregnancy.

## 2020-06-21 ENCOUNTER — Other Ambulatory Visit: Payer: Self-pay

## 2020-06-21 ENCOUNTER — Ambulatory Visit (INDEPENDENT_AMBULATORY_CARE_PROVIDER_SITE_OTHER): Payer: BC Managed Care – PPO | Admitting: Obstetrics and Gynecology

## 2020-06-21 ENCOUNTER — Encounter: Payer: Self-pay | Admitting: Obstetrics and Gynecology

## 2020-06-21 VITALS — BP 121/79 | HR 91 | Wt 206.7 lb

## 2020-06-21 DIAGNOSIS — Z3402 Encounter for supervision of normal first pregnancy, second trimester: Secondary | ICD-10-CM

## 2020-06-21 DIAGNOSIS — Z3A13 13 weeks gestation of pregnancy: Secondary | ICD-10-CM

## 2020-06-21 LAB — POCT URINALYSIS DIPSTICK OB
Bilirubin, UA: NEGATIVE
Blood, UA: NEGATIVE
Glucose, UA: NEGATIVE
Ketones, UA: NEGATIVE
Leukocytes, UA: NEGATIVE
Nitrite, UA: NEGATIVE
POC,PROTEIN,UA: NEGATIVE
Spec Grav, UA: 1.01 (ref 1.010–1.025)
Urobilinogen, UA: 0.2 E.U./dL
pH, UA: 7 (ref 5.0–8.0)

## 2020-06-21 NOTE — Progress Notes (Signed)
NOB: She presents today for her new OB visit.  She states that her back pain has resolved.  She does have daily nausea without vomiting.  She is using Diclegis in the evening and this seems to be working for her.  Her breast tenderness has resolved.  She is unsure regarding her chromosomal MaterniT 21 testing but she would like to keep her options open.  She is going to check the cost with her insurance before deciding.  aFP next visit.  Physical examination General NAD, Conversant  HEENT Atraumatic; Op clear with mmm.  Normo-cephalic. Pupils reactive. Anicteric sclerae  Thyroid/Neck Smooth without nodularity or enlargement. Normal ROM.  Neck Supple.  Skin No rashes, lesions or ulceration. Normal palpated skin turgor. No nodularity.  Breasts: No masses or discharge.  Symmetric.  No axillary adenopathy.  Lungs: Clear to auscultation.No rales or wheezes. Normal Respiratory effort, no retractions.  Heart: NSR.  No murmurs or rubs appreciated. No periferal edema  Abdomen: Soft.  Non-tender.  No masses.  No HSM. No hernia  Extremities: Moves all appropriately.  Normal ROM for age. No lymphadenopathy.  Neuro: Oriented to PPT.  Normal mood. Normal affect.     Pelvic:   Vulva: Normal appearance.  No lesions.  Vagina: No lesions or abnormalities noted.  Support: Normal pelvic support.  Urethra No masses tenderness or scarring.  Meatus Normal size without lesions or prolapse.  Cervix: Normal appearance.  No lesions.  Anus: Normal exam.  No lesions.  Perineum: Normal exam.  No lesions.        Bimanual   Adnexae: No masses.  Non-tender to palpation.  Uterus: Enlarged. 14 wks Pos FHts  Non-tender.  Mobile.  AV.  Adnexae: No masses.  Non-tender to palpation.  Cul-de-sac: Negative for abnormality.  Adnexae: No masses.  Non-tender to palpation.         Pelvimetry   Diagonal: Reached.  Spines: Average.  Sacrum: Concave.  Pubic Arch: Normal.

## 2020-07-07 ENCOUNTER — Encounter: Payer: Self-pay | Admitting: Surgical

## 2020-07-07 ENCOUNTER — Other Ambulatory Visit: Payer: Self-pay | Admitting: Surgical

## 2020-07-07 ENCOUNTER — Other Ambulatory Visit: Payer: Self-pay

## 2020-07-07 ENCOUNTER — Other Ambulatory Visit: Payer: BC Managed Care – PPO

## 2020-07-07 DIAGNOSIS — R300 Dysuria: Secondary | ICD-10-CM

## 2020-07-09 LAB — URINE CULTURE: Organism ID, Bacteria: NO GROWTH

## 2020-07-12 ENCOUNTER — Encounter: Payer: Self-pay | Admitting: Obstetrics and Gynecology

## 2020-07-12 ENCOUNTER — Ambulatory Visit (INDEPENDENT_AMBULATORY_CARE_PROVIDER_SITE_OTHER): Payer: BC Managed Care – PPO | Admitting: Obstetrics and Gynecology

## 2020-07-12 ENCOUNTER — Other Ambulatory Visit: Payer: Self-pay

## 2020-07-12 VITALS — BP 103/67 | HR 106 | Wt 206.2 lb

## 2020-07-12 DIAGNOSIS — M549 Dorsalgia, unspecified: Secondary | ICD-10-CM

## 2020-07-12 DIAGNOSIS — Z3402 Encounter for supervision of normal first pregnancy, second trimester: Secondary | ICD-10-CM

## 2020-07-12 DIAGNOSIS — Z6791 Unspecified blood type, Rh negative: Secondary | ICD-10-CM

## 2020-07-12 DIAGNOSIS — O26899 Other specified pregnancy related conditions, unspecified trimester: Secondary | ICD-10-CM | POA: Insufficient documentation

## 2020-07-12 DIAGNOSIS — O99891 Other specified diseases and conditions complicating pregnancy: Secondary | ICD-10-CM

## 2020-07-12 DIAGNOSIS — Z3A16 16 weeks gestation of pregnancy: Secondary | ICD-10-CM

## 2020-07-12 DIAGNOSIS — O99210 Obesity complicating pregnancy, unspecified trimester: Secondary | ICD-10-CM

## 2020-07-12 DIAGNOSIS — R110 Nausea: Secondary | ICD-10-CM

## 2020-07-12 LAB — POCT URINALYSIS DIPSTICK OB
Bilirubin, UA: NEGATIVE
Blood, UA: NEGATIVE
Glucose, UA: NEGATIVE
Ketones, UA: NEGATIVE
Leukocytes, UA: NEGATIVE
Nitrite, UA: NEGATIVE
POC,PROTEIN,UA: NEGATIVE
Spec Grav, UA: 1.025 (ref 1.010–1.025)
Urobilinogen, UA: 0.2 E.U./dL
pH, UA: 6.5 (ref 5.0–8.0)

## 2020-07-12 NOTE — Progress Notes (Signed)
ROB: Notes some lower back pain. Originally thought she might be getting a UTI, dropped of a urine and was noted to be normal.  Does have h/o recurrent UTI's and occasional hematuria, has been worked up by Urology in the past, was normal. Discussed belly band, stretches, and can consider PT if no resolution. Also notes persistent nausea, taking Diclegis, ginger-ale, peppermint. Rarely has to use her Zofran. Discussed smaller meals/snacks, milk shakes/protein shakes. GAD score is 2 (patient with h/o anxiety, previously on meds). Rh negative, discussed need for Rhogam at 28 weeks. RTC in 4 weeks, for anatomy scan. Discussed genetic screening, continues to decline.    The following were addressed during this visit:  Breastfeeding Education - Early initiation of breastfeeding    Comments: Keeps milk supply adequate, helps contract uterus and slow bleeding, and early milk is the perfect first food and is easy to digest.   - The importance of exclusive breastfeeding    Comments: Provides antibodies, Lower risk of breast and ovarian cancers, and type-2 diabetes,Helps your body recover, Reduced chance of SIDS.   - The importance of early skin-to-skin contact    Comments: Keeps baby warm and secure, helps keep baby's blood sugar up and breathing steady, easier to bond and breastfeed, and helps calm baby.  - Exclusive breastfeeding for the first 6 months    Comments: Builds a healthy milk supply and keeps it up, protects baby from sickness and disease, and breastmilk has everything your baby needs for the first 6 months.

## 2020-07-12 NOTE — Progress Notes (Signed)
ROB-Pt present for routine prenatal care. Pt c/o of lower back and vaginal pain. No other issues. GAD-7= score .

## 2020-07-12 NOTE — Patient Instructions (Signed)
Back Pain in Pregnancy Back pain during pregnancy is common. Back pain may be caused by several factors that are related to changes during your pregnancy. Follow these instructions at home: Managing pain, stiffness, and swelling      If directed, for sudden (acute) back pain, put ice on the painful area. ? Put ice in a plastic bag. ? Place a towel between your skin and the bag. ? Leave the ice on for 20 minutes, 2-3 times per day.  If directed, apply heat to the affected area before you exercise. Use the heat source that your health care provider recommends, such as a moist heat pack or a heating pad. ? Place a towel between your skin and the heat source. ? Leave the heat on for 20-30 minutes. ? Remove the heat if your skin turns bright red. This is especially important if you are unable to feel pain, heat, or cold. You may have a greater risk of getting burned.  If directed, massage the affected area. Activity  Exercise as told by your health care provider. Gentle exercise is the best way to prevent or manage back pain.  Listen to your body when lifting. If lifting hurts, ask for help or bend your knees. This uses your leg muscles instead of your back muscles.  Squat down when picking up something from the floor. Do not bend over.  Only use bed rest for short periods as told by your health care provider. Bed rest should only be used for the most severe episodes of back pain. Standing, sitting, and lying down  Do not stand in one place for long periods of time.  Use good posture when sitting. Make sure your head rests over your shoulders and is not hanging forward. Use a pillow on your lower back if necessary.  Try sleeping on your side, preferably the left side, with a pregnancy support pillow or 1-2 regular pillows between your legs. ? If you have back pain after a night's rest, your bed may be too soft. ? A firm mattress may provide more support for your back during  pregnancy. General instructions  Do not wear high heels.  Eat a healthy diet. Try to gain weight within your health care provider's recommendations.  Use a maternity girdle, elastic sling, or back brace as told by your health care provider.  Take over-the-counter and prescription medicines only as told by your health care provider.  Work with a physical therapist or massage therapist to find ways to manage back pain. Acupuncture or massage therapy may be helpful.  Keep all follow-up visits as told by your health care provider. This is important. Contact a health care provider if:  Your back pain interferes with your daily activities.  You have increasing pain in other parts of your body. Get help right away if:  You develop numbness, tingling, weakness, or problems with the use of your arms or legs.  You develop severe back pain that is not controlled with medicine.  You have a change in bowel or bladder control.  You develop shortness of breath, dizziness, or you faint.  You develop nausea, vomiting, or sweating.  You have back pain that is a rhythmic, cramping pain similar to labor pains. Labor pain is usually 1-2 minutes apart, lasts for about 1 minute, and involves a bearing down feeling or pressure in your pelvis.  You have back pain and your water breaks or you have vaginal bleeding.  You have back pain or numbness  that travels down your leg.  Your back pain developed after you fell.  You develop pain on one side of your back.  You see blood in your urine.  You develop skin blisters in the area of your back pain. Summary  Back pain may be caused by several factors that are related to changes during your pregnancy.  Follow instructions as told by your health care provider for managing pain, stiffness, and swelling.  Exercise as told by your health care provider. Gentle exercise is the best way to prevent or manage back pain.  Take over-the-counter and  prescription medicines only as told by your health care provider.  Keep all follow-up visits as told by your health care provider. This is important. This information is not intended to replace advice given to you by your health care provider. Make sure you discuss any questions you have with your health care provider. Document Revised: 04/07/2019 Document Reviewed: 06/04/2018 Elsevier Patient Education  [place] of Pregnancy  The second trimester is from week 14 through week 27 (month 4 through 6). This is often the time in pregnancy that you feel your best. Often times, morning sickness has lessened or quit. You may have more energy, and you may get hungry more often. Your unborn baby is growing rapidly. At the end of the sixth month, he or she is about 9 inches long and weighs about 1 pounds. You will likely feel the baby move between 18 and 20 weeks of pregnancy. Follow these instructions at home: Medicines  Take over-the-counter and prescription medicines only as told by your doctor. Some medicines are safe and some medicines are not safe during pregnancy.  Take a prenatal vitamin that contains at least 600 micrograms (mcg) of folic acid.  If you have trouble pooping (constipation), take medicine that will make your stool soft (stool softener) if your doctor approves. Eating and drinking   Eat regular, healthy meals.  Avoid raw meat and uncooked cheese.  If you get low calcium from the food you eat, talk to your doctor about taking a daily calcium supplement.  Avoid foods that are high in fat and sugars, such as fried and sweet foods.  If you feel sick to your stomach (nauseous) or throw up (vomit): ? Eat 4 or 5 small meals a day instead of 3 large meals. ? Try eating a few soda crackers. ? Drink liquids between meals instead of during meals.  To prevent constipation: ? Eat foods that are high in fiber, like fresh fruits and vegetables, whole  grains, and beans. ? Drink enough fluids to keep your pee (urine) clear or pale yellow. Activity  Exercise only as told by your doctor. Stop exercising if you start to have cramps.  Do not exercise if it is too hot, too humid, or if you are in a place of great height (high altitude).  Avoid heavy lifting.  Wear low-heeled shoes. Sit and stand up straight.  You can continue to have sex unless your doctor tells you not to. Relieving pain and discomfort  Wear a good support bra if your breasts are tender.  Take warm water baths (sitz baths) to soothe pain or discomfort caused by hemorrhoids. Use hemorrhoid cream if your doctor approves.  Rest with your legs raised if you have leg cramps or low back pain.  If you develop puffy, bulging veins (varicose veins) in your legs: ? Wear support hose or compression stockings as told by your doctor. ?  Raise (elevate) your feet for 15 minutes, 3-4 times a day. ? Limit salt in your food. Prenatal care  Write down your questions. Take them to your prenatal visits.  Keep all your prenatal visits as told by your doctor. This is important. Safety  Wear your seat belt when driving.  Make a list of emergency phone numbers, including numbers for family, friends, the hospital, and police and fire departments. General instructions  Ask your doctor about the right foods to eat or for help finding a counselor, if you need these services.  Ask your doctor about local prenatal classes. Begin classes before month 6 of your pregnancy.  Do not use hot tubs, steam rooms, or saunas.  Do not douche or use tampons or scented sanitary pads.  Do not cross your legs for long periods of time.  Visit your dentist if you have not done so. Use a soft toothbrush to brush your teeth. Floss gently.  Avoid all smoking, herbs, and alcohol. Avoid drugs that are not approved by your doctor.  Do not use any products that contain nicotine or tobacco, such as  cigarettes and e-cigarettes. If you need help quitting, ask your doctor.  Avoid cat litter boxes and soil used by cats. These carry germs that can cause birth defects in the baby and can cause a loss of your baby (miscarriage) or stillbirth. Contact a doctor if:  You have mild cramps or pressure in your lower belly.  You have pain when you pee (urinate).  You have bad smelling fluid coming from your vagina.  You continue to feel sick to your stomach (nauseous), throw up (vomit), or have watery poop (diarrhea).  You have a nagging pain in your belly area.  You feel dizzy. Get help right away if:  You have a fever.  You are leaking fluid from your vagina.  You have spotting or bleeding from your vagina.  You have severe belly cramping or pain.  You lose or gain weight rapidly.  You have trouble catching your breath and have chest pain.  You notice sudden or extreme puffiness (swelling) of your face, hands, ankles, feet, or legs.  You have not felt the baby move in over an hour.  You have severe headaches that do not go away when you take medicine.  You have trouble seeing. Summary  The second trimester is from week 14 through week 27 (months 4 through 6). This is often the time in pregnancy that you feel your best.  To take care of yourself and your unborn baby, you will need to eat healthy meals, take medicines only if your doctor tells you to do so, and do activities that are safe for you and your baby.  Call your doctor if you get sick or if you notice anything unusual about your pregnancy. Also, call your doctor if you need help with the right food to eat, or if you want to know what activities are safe for you. This information is not intended to replace advice given to you by your health care provider. Make sure you discuss any questions you have with your health care provider. Document Revised: 04/10/2019 Document Reviewed: 01/22/2017 Elsevier Patient Education   [place]. Common Medications Safe in Pregnancy  Acne:      Constipation:  Benzoyl Peroxide     Colace  Clindamycin      Dulcolax Suppository  Topica Erythromycin     Fibercon  Salicylic Acid  Metamucil         Miralax AVOID:        Senakot   Accutane    Cough:  Retin-A       Cough Drops  Tetracycline      Phenergan w/ Codeine if Rx  Minocycline      Robitussin (Plain & DM)  Antibiotics:     Crabs/Lice:  Ceclor       RID  Cephalosporins    AVOID:  E-Mycins      Kwell  Keflex  Macrobid/Macrodantin   Diarrhea:  Penicillin      Kao-Pectate  Zithromax      Imodium AD         PUSH FLUIDS AVOID:       Cipro     Fever:  Tetracycline      Tylenol (Regular or Extra  Minocycline       Strength)  Levaquin      Extra Strength-Do not          Exceed 8 tabs/24 hrs Caffeine:        <257m/day (equiv. To 1 cup of coffee or  approx. 3 12 oz sodas)         Gas: Cold/Hayfever:       Gas-X  Benadryl      Mylicon  Claritin       Phazyme  **Claritin-D        Chlor-Trimeton    Headaches:  Dimetapp      ASA-Free Excedrin  Drixoral-Non-Drowsy     Cold Compress  Mucinex (Guaifenasin)     Tylenol (Regular or Extra  Sudafed/Sudafed-12 Hour     Strength)  **Sudafed PE Pseudoephedrine   Tylenol Cold & Sinus     Vicks Vapor Rub  Zyrtec  **AVOID if Problems With Blood Pressure         Heartburn: Avoid lying down for at least 1 hour after meals  Aciphex      Maalox     Rash:  Milk of Magnesia     Benadryl    Mylanta       1% Hydrocortisone Cream  Pepcid  Pepcid Complete   Sleep Aids:  Prevacid      Ambien   Prilosec       Benadryl  Rolaids       Chamomile Tea  Tums (Limit 4/day)     Unisom  Zantac       Tylenol PM         Warm milk-add vanilla or  Hemorrhoids:       Sugar for taste  Anusol/Anusol H.C.  (RX: Analapram 2.5%)  Sugar Substitutes:  Hydrocortisone OTC     Ok in moderation  Preparation H      Tucks        Vaseline lotion applied to tissue with  wiping    Herpes:     Throat:  Acyclovir      Oragel  Famvir  Valtrex     Vaccines:         Flu Shot Leg Cramps:       *Gardasil  Benadryl      Hepatitis A         Hepatitis B Nasal Spray:       Pneumovax  Saline Nasal Spray     Polio Booster         Tetanus Nausea:       Tuberculosis test or PPD  Vitamin B6 25 mg TID   AVOID:  Dramamine      *Gardasil  Emetrol       Live Poliovirus  Ginger Root 250 mg QID    MMR (measles, mumps &  High Complex Carbs @ Bedtime    rebella)  Sea Bands-Accupressure    Varicella (Chickenpox)  Unisom 1/2 tab TID     *No known complications           If received before Pain:         Known pregnancy;   Darvocet       Resume series after  Lortab        Delivery  Percocet    Yeast:   Tramadol      Femstat  Tylenol 3      Gyne-lotrimin  Ultram       Monistat  Vicodin           MISC:         All Sunscreens           Hair Coloring/highlights          Insect Repellant's          (Including DEET)         Mystic Tans Breastfeeding  Choosing to breastfeed is one of the best decisions you can make for yourself and your baby. A change in hormones during pregnancy causes your breasts to make breast milk in your milk-producing glands. Hormones prevent breast milk from being released before your baby is born. They also prompt milk flow after birth. Once breastfeeding has begun, thoughts of your baby, as well as his or her sucking or crying, can stimulate the release of milk from your milk-producing glands. Benefits of breastfeeding Research shows that breastfeeding offers many health benefits for infants and mothers. It also offers a cost-free and convenient way to feed your baby. For your baby  Your first milk (colostrum) helps your baby's digestive system to function better.  Special cells in your milk (antibodies) help your baby to fight off infections.  Breastfed babies are less likely to develop asthma, allergies, obesity, or type 2 diabetes. They  are also at lower risk for sudden infant death syndrome (SIDS).  Nutrients in breast milk are better able to meet your babys needs compared to infant formula.  Breast milk improves your baby's brain development. For you  Breastfeeding helps to create a very special bond between you and your baby.  Breastfeeding is convenient. Breast milk costs nothing and is always available at the correct temperature.  Breastfeeding helps to burn calories. It helps you to lose the weight that you gained during pregnancy.  Breastfeeding makes your uterus return faster to its size before pregnancy. It also slows bleeding (lochia) after you give birth.  Breastfeeding helps to lower your risk of developing type 2 diabetes, osteoporosis, rheumatoid arthritis, cardiovascular disease, and breast, ovarian, uterine, and endometrial cancer later in life. Breastfeeding basics Starting breastfeeding  Find a comfortable place to sit or lie down, with your neck and back well-supported.  Place a pillow or a rolled-up blanket under your baby to bring him or her to the level of your breast (if you are seated). Nursing pillows are specially designed to help support your arms and your baby while you breastfeed.  Make sure that your baby's tummy (abdomen) is facing your abdomen.  Gently massage your breast. With your fingertips, massage from the outer edges of your breast inward toward the nipple. This encourages milk flow. If your milk flows slowly, you  may need to continue this action during the feeding.  Support your breast with 4 fingers underneath and your thumb above your nipple (make the letter "C" with your hand). Make sure your fingers are well away from your nipple and your babys mouth.  Stroke your baby's lips gently with your finger or nipple.  When your baby's mouth is open wide enough, quickly bring your baby to your breast, placing your entire nipple and as much of the areola as possible into your baby's  mouth. The areola is the colored area around your nipple. ? More areola should be visible above your baby's upper lip than below the lower lip. ? Your baby's lips should be opened and extended outward (flanged) to ensure an adequate, comfortable latch. ? Your baby's tongue should be between his or her lower gum and your breast.  Make sure that your baby's mouth is correctly positioned around your nipple (latched). Your baby's lips should create a seal on your breast and be turned out (everted).  It is common for your baby to suck about 2-3 minutes in order to start the flow of breast milk. Latching Teaching your baby how to latch onto your breast properly is very important. An improper latch can cause nipple pain, decreased milk supply, and poor weight gain in your baby. Also, if your baby is not latched onto your nipple properly, he or she may swallow some air during feeding. This can make your baby fussy. Burping your baby when you switch breasts during the feeding can help to get rid of the air. However, teaching your baby to latch on properly is still the best way to prevent fussiness from swallowing air while breastfeeding. Signs that your baby has successfully latched onto your nipple  Silent tugging or silent sucking, without causing you pain. Infant's lips should be extended outward (flanged).  Swallowing heard between every 3-4 sucks once your milk has started to flow (after your let-down milk reflex occurs).  Muscle movement above and in front of his or her ears while sucking. Signs that your baby has not successfully latched onto your nipple  Sucking sounds or smacking sounds from your baby while breastfeeding.  Nipple pain. If you think your baby has not latched on correctly, slip your finger into the corner of your babys mouth to break the suction and place it between your baby's gums. Attempt to start breastfeeding again. Signs of successful breastfeeding Signs from your  baby  Your baby will gradually decrease the number of sucks or will completely stop sucking.  Your baby will fall asleep.  Your baby's body will relax.  Your baby will retain a small amount of milk in his or her mouth.  Your baby will let go of your breast by himself or herself. Signs from you  Breasts that have increased in firmness, weight, and size 1-3 hours after feeding.  Breasts that are softer immediately after breastfeeding.  Increased milk volume, as well as a change in milk consistency and color by the fifth day of breastfeeding.  Nipples that are not sore, cracked, or bleeding. Signs that your baby is getting enough milk  Wetting at least 1-2 diapers during the first 24 hours after birth.  Wetting at least 5-6 diapers every 24 hours for the first week after birth. The urine should be clear or pale yellow by the age of 5 days.  Wetting 6-8 diapers every 24 hours as your baby continues to grow and develop.  At least 3  stools in a 24-hour period by the age of 5 days. The stool should be soft and yellow.  At least 3 stools in a 24-hour period by the age of 7 days. The stool should be seedy and yellow.  No loss of weight greater than 10% of birth weight during the first 3 days of life.  Average weight gain of 4-7 oz (113-198 g) per week after the age of 4 days.  Consistent daily weight gain by the age of 5 days, without weight loss after the age of 2 weeks. After a feeding, your baby may spit up a small amount of milk. This is normal. Breastfeeding frequency and duration Frequent feeding will help you make more milk and can prevent sore nipples and extremely full breasts (breast engorgement). Breastfeed when you feel the need to reduce the fullness of your breasts or when your baby shows signs of hunger. This is called "breastfeeding on demand." Signs that your baby is hungry include:  Increased alertness, activity, or restlessness.  Movement of the head from side to  side.  Opening of the mouth when the corner of the mouth or cheek is stroked (rooting).  Increased sucking sounds, smacking lips, cooing, sighing, or squeaking.  Hand-to-mouth movements and sucking on fingers or hands.  Fussing or crying. Avoid introducing a pacifier to your baby in the first 4-6 weeks after your baby is born. After this time, you may choose to use a pacifier. Research has shown that pacifier use during the first year of a baby's life decreases the risk of sudden infant death syndrome (SIDS). Allow your baby to feed on each breast as long as he or she wants. When your baby unlatches or falls asleep while feeding from the first breast, offer the second breast. Because newborns are often sleepy in the first few weeks of life, you may need to awaken your baby to get him or her to feed. Breastfeeding times will vary from baby to baby. However, the following rules can serve as a guide to help you make sure that your baby is properly fed:  Newborns (babies 78 weeks of age or younger) may breastfeed every 1-3 hours.  Newborns should not go without breastfeeding for longer than 3 hours during the day or 5 hours during the night.  You should breastfeed your baby a minimum of 8 times in a 24-hour period. Breast milk pumping     Pumping and storing breast milk allows you to make sure that your baby is exclusively fed your breast milk, even at times when you are unable to breastfeed. This is especially important if you go back to work while you are still breastfeeding, or if you are not able to be present during feedings. Your lactation consultant can help you find a method of pumping that works best for you and give you guidelines about how long it is safe to store breast milk. Caring for your breasts while you breastfeed Nipples can become dry, cracked, and sore while breastfeeding. The following recommendations can help keep your breasts moisturized and healthy:  Avoid using soap on  your nipples.  Wear a supportive bra designed especially for nursing. Avoid wearing underwire-style bras or extremely tight bras (sports bras).  Air-dry your nipples for 3-4 minutes after each feeding.  Use only cotton bra pads to absorb leaked breast milk. Leaking of breast milk between feedings is normal.  Use lanolin on your nipples after breastfeeding. Lanolin helps to maintain your skin's normal moisture barrier.  Pure lanolin is not harmful (not toxic) to your baby. You may also hand express a few drops of breast milk and gently massage that milk into your nipples and allow the milk to air-dry. In the first few weeks after giving birth, some women experience breast engorgement. Engorgement can make your breasts feel heavy, warm, and tender to the touch. Engorgement peaks within 3-5 days after you give birth. The following recommendations can help to ease engorgement:  Completely empty your breasts while breastfeeding or pumping. You may want to start by applying warm, moist heat (in the shower or with warm, water-soaked hand towels) just before feeding or pumping. This increases circulation and helps the milk flow. If your baby does not completely empty your breasts while breastfeeding, pump any extra milk after he or she is finished.  Apply ice packs to your breasts immediately after breastfeeding or pumping, unless this is too uncomfortable for you. To do this: ? Put ice in a plastic bag. ? Place a towel between your skin and the bag. ? Leave the ice on for 20 minutes, 2-3 times a day.  Make sure that your baby is latched on and positioned properly while breastfeeding. If engorgement persists after 48 hours of following these recommendations, contact your health care provider or a Science writer. Overall health care recommendations while breastfeeding  Eat 3 healthy meals and 3 snacks every day. Well-nourished mothers who are breastfeeding need an additional 450-500 calories a day.  You can meet this requirement by increasing the amount of a balanced diet that you eat.  Drink enough water to keep your urine pale yellow or clear.  Rest often, relax, and continue to take your prenatal vitamins to prevent fatigue, stress, and low vitamin and mineral levels in your body (nutrient deficiencies).  Do not use any products that contain nicotine or tobacco, such as cigarettes and e-cigarettes. Your baby may be harmed by chemicals from cigarettes that pass into breast milk and exposure to secondhand smoke. If you need help quitting, ask your health care provider.  Avoid alcohol.  Do not use illegal drugs or marijuana.  Talk with your health care provider before taking any medicines. These include over-the-counter and prescription medicines as well as vitamins and herbal supplements. Some medicines that may be harmful to your baby can pass through breast milk.  It is possible to become pregnant while breastfeeding. If birth control is desired, ask your health care provider about options that will be safe while breastfeeding your baby. Where to find more information: [employer]: [email or web address].org Contact a health care provider if:  You feel like you want to stop breastfeeding or have become frustrated with breastfeeding.  Your nipples are cracked or bleeding.  Your breasts are red, tender, or warm.  You have: ? Painful breasts or nipples. ? A swollen area on either breast. ? A fever or chills. ? Nausea or vomiting. ? Drainage other than breast milk from your nipples.  Your breasts do not become full before feedings by the fifth day after you give birth.  You feel sad and depressed.  Your baby is: ? Too sleepy to eat well. ? Having trouble sleeping. ? More than 90 week old and wetting fewer than 6 diapers in a 24-hour period. ? Not gaining weight by 62 days of age.  Your baby has fewer than 3 stools in a 24-hour period.  Your baby's skin or the white  parts of his or her eyes become yellow. Get  help right away if:  Your baby is overly tired (lethargic) and does not want to wake up and feed.  Your baby develops an unexplained fever. Summary  Breastfeeding offers many health benefits for infant and mothers.  Try to breastfeed your infant when he or she shows early signs of hunger.  Gently tickle or stroke your baby's lips with your finger or nipple to allow the baby to open his or her mouth. Bring the baby to your breast. Make sure that much of the areola is in your baby's mouth. Offer one side and burp the baby before you offer the other side.  Talk with your health care provider or lactation consultant if you have questions or you face problems as you breastfeed. This information is not intended to replace advice given to you by your health care provider. Make sure you discuss any questions you have with your health care provider. Document Revised: 03/13/2018 Document Reviewed: 01/18/2017 Elsevier Patient Education  [place].

## 2020-08-09 ENCOUNTER — Encounter: Payer: BC Managed Care – PPO | Admitting: Obstetrics and Gynecology

## 2020-08-09 ENCOUNTER — Other Ambulatory Visit: Payer: BC Managed Care – PPO

## 2020-08-11 ENCOUNTER — Encounter: Payer: Self-pay | Admitting: Obstetrics and Gynecology

## 2020-08-11 ENCOUNTER — Ambulatory Visit (INDEPENDENT_AMBULATORY_CARE_PROVIDER_SITE_OTHER): Payer: BC Managed Care – PPO | Admitting: Obstetrics and Gynecology

## 2020-08-11 ENCOUNTER — Other Ambulatory Visit: Payer: Self-pay

## 2020-08-11 ENCOUNTER — Ambulatory Visit (INDEPENDENT_AMBULATORY_CARE_PROVIDER_SITE_OTHER): Payer: BC Managed Care – PPO

## 2020-08-11 VITALS — BP 110/73 | HR 106 | Wt 203.6 lb

## 2020-08-11 DIAGNOSIS — Z3A20 20 weeks gestation of pregnancy: Secondary | ICD-10-CM | POA: Diagnosis not present

## 2020-08-11 DIAGNOSIS — Z3402 Encounter for supervision of normal first pregnancy, second trimester: Secondary | ICD-10-CM

## 2020-08-11 LAB — POCT URINALYSIS DIPSTICK OB
Bilirubin, UA: NEGATIVE
Blood, UA: NEGATIVE
Glucose, UA: NEGATIVE
Ketones, UA: NEGATIVE
Leukocytes, UA: NEGATIVE
Nitrite, UA: NEGATIVE
POC,PROTEIN,UA: NEGATIVE
Spec Grav, UA: 1.01 (ref 1.010–1.025)
Urobilinogen, UA: 0.2 E.U./dL
pH, UA: 7 (ref 5.0–8.0)

## 2020-08-11 NOTE — Progress Notes (Signed)
ROB:  FAS today-incomplete.  Also EIF noted.  Discussed EIF in detail with the patient.  Offered genetic testing today or after repeat ultrasound at 28 weeks if EIF persists.  Patient chose genetic testing today.  Discussed Covid vaccination during pregnancy.  Patient reports daily fetal movement.  Taking prenatal vitamins as directed.  Patient has occasional nausea.

## 2020-08-16 ENCOUNTER — Other Ambulatory Visit: Payer: Self-pay | Admitting: Obstetrics and Gynecology

## 2020-08-16 DIAGNOSIS — Z09 Encounter for follow-up examination after completed treatment for conditions other than malignant neoplasm: Secondary | ICD-10-CM

## 2020-08-16 LAB — MATERNIT21  PLUS CORE+ESS+SCA, BLOOD

## 2020-08-17 ENCOUNTER — Other Ambulatory Visit: Payer: Self-pay

## 2020-08-17 ENCOUNTER — Ambulatory Visit (INDEPENDENT_AMBULATORY_CARE_PROVIDER_SITE_OTHER): Payer: BC Managed Care – PPO

## 2020-08-17 DIAGNOSIS — Z09 Encounter for follow-up examination after completed treatment for conditions other than malignant neoplasm: Secondary | ICD-10-CM

## 2020-08-17 DIAGNOSIS — Z3A21 21 weeks gestation of pregnancy: Secondary | ICD-10-CM

## 2020-09-06 ENCOUNTER — Other Ambulatory Visit: Payer: Self-pay

## 2020-09-06 MED ORDER — HYDROXYZINE HCL 25 MG PO TABS: 25.0000 mg | ORAL_TABLET | Freq: Four times a day (QID) | ORAL | 0 refills | Status: DC | PRN

## 2020-09-08 NOTE — Progress Notes (Signed)
ROB-Pt present for routine prenatal care. Pt stated having lower abd and pain in the vaginal area.

## 2020-09-09 ENCOUNTER — Encounter: Payer: Self-pay | Admitting: Obstetrics and Gynecology

## 2020-09-09 ENCOUNTER — Other Ambulatory Visit: Payer: Self-pay

## 2020-09-09 ENCOUNTER — Ambulatory Visit (INDEPENDENT_AMBULATORY_CARE_PROVIDER_SITE_OTHER): Payer: BC Managed Care – PPO | Admitting: Obstetrics and Gynecology

## 2020-09-09 VITALS — BP 121/79 | HR 97 | Wt 205.7 lb

## 2020-09-09 DIAGNOSIS — O358XX0 Maternal care for other (suspected) fetal abnormality and damage, not applicable or unspecified: Secondary | ICD-10-CM

## 2020-09-09 DIAGNOSIS — F419 Anxiety disorder, unspecified: Secondary | ICD-10-CM

## 2020-09-09 DIAGNOSIS — Z3A24 24 weeks gestation of pregnancy: Secondary | ICD-10-CM

## 2020-09-09 DIAGNOSIS — Z3402 Encounter for supervision of normal first pregnancy, second trimester: Secondary | ICD-10-CM

## 2020-09-09 DIAGNOSIS — O99340 Other mental disorders complicating pregnancy, unspecified trimester: Secondary | ICD-10-CM

## 2020-09-09 DIAGNOSIS — O35BXX0 Maternal care for other (suspected) fetal abnormality and damage, fetal cardiac anomalies, not applicable or unspecified: Secondary | ICD-10-CM

## 2020-09-09 DIAGNOSIS — Z131 Encounter for screening for diabetes mellitus: Secondary | ICD-10-CM

## 2020-09-09 DIAGNOSIS — Z8659 Personal history of other mental and behavioral disorders: Secondary | ICD-10-CM | POA: Insufficient documentation

## 2020-09-09 LAB — POCT URINALYSIS DIPSTICK OB
Bilirubin, UA: NEGATIVE
Blood, UA: NEGATIVE
Glucose, UA: NEGATIVE
Ketones, UA: NEGATIVE
Leukocytes, UA: NEGATIVE
Nitrite, UA: NEGATIVE
POC,PROTEIN,UA: NEGATIVE
Spec Grav, UA: <=1.005 — AB (ref 1.010–1.025)
Urobilinogen, UA: 0.2 E.U./dL
pH, UA: 6.5 (ref 5.0–8.0)

## 2020-09-09 MED ORDER — ONDANSETRON HCL 4 MG PO TABS: 4.0000 mg | ORAL_TABLET | Freq: Three times a day (TID) | ORAL | 0 refills | Status: DC | PRN

## 2020-09-09 NOTE — Progress Notes (Signed)
ROB: Patient reports that her anxiety has increased.  Was prescribed Atarax last visit but does not feel it is helping as much. Advised that she could increase her dosing from 25 to 50 mg, or could resume her Buspar. Patient notes she will try the Buspar again first. Discussed breastfeeding, which patient plans to perform. RTC in 4 weeks, for 28 week labs, Rhogam, and repeat ultrasound for EIF.    The following were addressed during this visit:  Breastfeeding Education - Feeding on demand or baby-led feeding    Comments: Helps prevent breastfeeding complications, helps bring in good milk supply, prevents under or overfeeding, and helps baby feel content and satisfied   - Frequent feeding to help assure optimal milk production    Comments: Making a full supply of milk requires frequent removal of milk from breasts, infant will eat 8-12 times in 24 hours, if separated from infant use breast massage, hand expression and/ or pumping to remove milk from breasts.   - Effective positioning and attachment    Comments: Helps my baby to get enough breast milk, helps to produce an adequate milk supply, and helps prevent nipple pain and damage

## 2020-09-09 NOTE — Patient Instructions (Signed)
Perinatal Anxiety When a woman feels excessive tension or worry (anxiety) during pregnancy or during the first 12 months after she gives birth, she has a condition called perinatal anxiety. Anxiety can interfere with work, school, relationships, and other everyday activities. If it is not managed properly, it can also cause problems in the mother and her baby.  If you are pregnant and you have symptoms of an anxiety disorder, it is important to talk with your health care provider. What are the causes? The exact cause of this condition is not known. Hormonal changes during and after pregnancy may play a role in causing perinatal anxiety. What increases the risk? You are more likely to develop this condition if:  You have a personal or family history of depression, anxiety, or mood disorders.  You experience a stressful life event during pregnancy, such as the death of a loved one.  You have a lot of regular life stress, such as being a single parent.  You have thyroid problems. What are the signs or symptoms? Perinatal anxiety can be different for everyone. It may include:  Panic attacks (panic disorder). These are intense episodes of fear or discomfort that may also cause sweating, nausea, shortness of breath, or fear of dying. They usually last 5-15 minutes.  Reliving an upsetting (traumatic) event through distressing thoughts, dreams, or flashbacks (post-traumatic stress disorder, or PTSD).  Excessive worry about multiple problems (generalized anxiety disorder).  Fear and stress about leaving certain people or loved ones (separation anxiety).  Performing repetitive tasks (compulsions) to relieve stress or worry (obsessive compulsive disorder, or OCD).  Fear of certain objects or situations (phobias).  Excessive worrying, such as a constant feeling that something bad is going to happen.  Inability to relax.  Difficulty concentrating.  Sleep problems.  Frequent nightmares or  disturbing thoughts. How is this diagnosed? This condition is diagnosed based on a physical exam and mental evaluation. In some cases, your health care provider may use an anxiety screening tool. These tools include a list of questions that can help a health care provider diagnose anxiety. Your health care provider may refer you to a mental health expert who specializes in anxiety. How is this treated? This condition may be treated with:  Medicines. Your health care provider will only give you medicines that have been proven safe for pregnancy and breastfeeding.  Talk therapy with a mental health professional to help change your patterns of thinking (cognitive behavioral therapy).  Mindfulness-based stress reduction.  Other relaxation therapies, such as deep breathing or guided muscle relaxation.  Support groups. Follow these instructions at home: Lifestyle  Do not use any products that contain nicotine or tobacco, such as cigarettes and e-cigarettes. If you need help quitting, ask your health care provider.  Do not use alcohol when you are pregnant. After your baby is born, limit alcohol intake to no more than 1 drink a day. One drink equals 12 oz of beer, 5 oz of wine, or 1 oz of hard liquor.  Consider joining a support group for new mothers. Ask your health care provider for recommendations.  Take good care of yourself. Make sure you: ? Get plenty of sleep. If you are having trouble sleeping, talk with your health care provider. ? Eat a healthy diet. This includes plenty of fruits and vegetables, whole grains, and lean proteins. ? Exercise regularly, as told by your health care provider. Ask your health care provider what exercises are safe for you. General instructions  Take over-the-counter  and prescription medicines only as told by your health care provider.  Talk with your partner or family members about your feelings during pregnancy. Share any concerns or fears that you may  have.  Ask for help with tasks or chores when you need it. Ask friends and family members to provide meals, watch your children, or help with cleaning.  Keep all follow-up visits as told by your health care provider. This is important. Contact a health care provider if:  You (or people close to you) notice that you have any symptoms of anxiety or depression.  You have anxiety and your symptoms get worse.  You experience side effects from medicines, such as nausea or sleep problems. Get help right away if:  You feel like hurting yourself, your baby, or someone else. If you ever feel like you may hurt yourself or others, or have thoughts about taking your own life, get help right away. You can go to your nearest emergency department or call:  Your local emergency services (911 in the [place]  A suicide crisis helpline, such as the [facility] at 361-011-5219. This is open 24 hours a day. Summary  Perinatal anxiety is when a woman feels excessive tension or worry during pregnancy or during the first 12 months after she gives birth.  Perinatal anxiety may include panic attacks, post-traumatic stress disorder, separation anxiety, phobias, or generalized anxiety.  Perinatal anxiety can cause physical health problems in the mother and baby if not properly managed.  This condition is treated with medicines, talk therapy, stress reduction therapies, or a combination of two or more treatments.  Talk with your partner or family members about your concerns or fears. Do not be afraid to ask for help. This information is not intended to replace advice given to you by your health care provider. Make sure you discuss any questions you have with your health care provider. Document Revised: 12/20/2017 Document Reviewed: 02/13/2017 Elsevier Patient Education  [place].

## 2020-09-16 ENCOUNTER — Other Ambulatory Visit: Payer: Self-pay

## 2020-09-16 MED ORDER — ONDANSETRON 4 MG PO TBDP
4.0000 mg | ORAL_TABLET | Freq: Three times a day (TID) | ORAL | 0 refills | Status: DC | PRN
Start: 2020-09-16 — End: 2020-12-01

## 2020-09-20 ENCOUNTER — Other Ambulatory Visit: Payer: Self-pay

## 2020-09-20 ENCOUNTER — Observation Stay
Admission: EM | Admit: 2020-09-20 | Discharge: 2020-09-21 | Disposition: A | Discharge status: Home / Self Care | Payer: BC Managed Care – PPO | Attending: Obstetrics and Gynecology | Admitting: Obstetrics and Gynecology

## 2020-09-20 ENCOUNTER — Encounter: Payer: Self-pay | Admitting: Obstetrics and Gynecology

## 2020-09-20 DIAGNOSIS — R109 Unspecified abdominal pain: Secondary | ICD-10-CM | POA: Diagnosis present

## 2020-09-20 DIAGNOSIS — Z85828 Personal history of other malignant neoplasm of skin: Secondary | ICD-10-CM | POA: Insufficient documentation

## 2020-09-20 DIAGNOSIS — Z91040 Latex allergy status: Secondary | ICD-10-CM | POA: Diagnosis not present

## 2020-09-20 DIAGNOSIS — Z3A26 26 weeks gestation of pregnancy: Secondary | ICD-10-CM | POA: Diagnosis not present

## 2020-09-20 DIAGNOSIS — R110 Nausea: Secondary | ICD-10-CM | POA: Diagnosis present

## 2020-09-20 DIAGNOSIS — Z79899 Other long term (current) drug therapy: Secondary | ICD-10-CM | POA: Diagnosis not present

## 2020-09-20 DIAGNOSIS — O26892 Other specified pregnancy related conditions, second trimester: Principal | ICD-10-CM | POA: Diagnosis present

## 2020-09-20 DIAGNOSIS — Z3490 Encounter for supervision of normal pregnancy, unspecified, unspecified trimester: Secondary | ICD-10-CM

## 2020-09-20 HISTORY — DX: Anxiety disorder, unspecified: F41.9

## 2020-09-20 MED ORDER — ACETAMINOPHEN 325 MG PO TABS
ORAL_TABLET | ORAL | Status: AC
  Filled 2020-09-20: qty 2

## 2020-09-20 MED ORDER — ACETAMINOPHEN 325 MG PO TABS
650.0000 mg | ORAL_TABLET | Freq: Four times a day (QID) | ORAL | Status: DC | PRN
  Administered 2020-09-20: 650 mg via ORAL

## 2020-09-20 MED ORDER — ONDANSETRON HCL 4 MG PO TABS
8.0000 mg | ORAL_TABLET | Freq: Three times a day (TID) | ORAL | Status: DC | PRN
  Filled 2020-09-20: qty 2

## 2020-09-20 MED ORDER — PROMETHAZINE HCL 25 MG PO TABS: 25.0000 mg | ORAL_TABLET | Freq: Four times a day (QID) | ORAL | Status: DC | PRN

## 2020-09-20 MED ORDER — METOCLOPRAMIDE HCL 5 MG/5ML PO SOLN
10.0000 mg | Freq: Four times a day (QID) | ORAL | Status: DC | PRN
  Administered 2020-09-20: 10 mg via ORAL
  Filled 2020-09-20 (×2): qty 10

## 2020-09-20 NOTE — Discharge Summary (Signed)
L&D OB Triage Note  HPI:  Mandy Hughes is a 30 y.o. G1P0000 female at [redacted]w[redacted]d. Estimated Date of Delivery: 12/24/20 who presents for complaints of abdominal pain x 1 day and nausea.  Patient notes that earlier today she began to feel more nauseated than usual (has a h/o persistent nausea this pregnancy) and around 5 or 6 pm she noted a sharp pain in her abdomen and back that lasted for about 10-15 minutes (this happened approximately 2 more times within an hour). Pain was about 8/10 in nature. States she contacted the nurse triage line who recommended that she be seen for evaluation. Since being in triage has not had an episode of pain, but is still nauseated.  Last ate at approximately 5 pm. Took a dose of Zofran at approximately 6 pm.    OB History  Gravida Para Term Preterm AB Living  1 0 0 0 0 0  SAB TAB Ectopic Multiple Live Births  0 0 0 0 0    # Outcome Date GA Lbr Len/2nd Weight Sex Delivery Anes PTL Lv  1 Current             Patient Active Problem List   Diagnosis Date Noted  . Anxiety during pregnancy 09/09/2020  . Echogenic focus of heart of fetus affecting antepartum care of mother 09/09/2020  . Rh negative state in antepartum period 07/12/2020    Past Medical History:  Diagnosis Date  . Anxiety   . Cancer ([place])    basal cell carcinoma  . Depression     No current facility-administered medications on file prior to encounter.   Current Outpatient Medications on File Prior to Encounter  Medication Sig Dispense Refill  . acetaminophen (TYLENOL) 500 MG tablet Take 500 mg by mouth every 6 (six) hours as needed.    . busPIRone (BUSPAR) 15 MG tablet Take 15 mg by mouth 3 (three) times daily.    Marland Kitchen docusate sodium (COLACE) 100 MG capsule Take 100 mg by mouth 2 (two) times daily.    . Doxylamine-Pyridoxine (DICLEGIS) 10-10 MG TBEC Take 2 tablets by mouth at bedtime. If symptoms persist, add one tablet in the morning and one in the afternoon 100 tablet 5  . hydrOXYzine  (ATARAX/VISTARIL) 25 MG tablet Take 1 tablet (25 mg total) by mouth every 6 (six) hours as needed for itching. 30 tablet 0  . ondansetron (ZOFRAN) 4 MG tablet Take 1 tablet (4 mg total) by mouth every 8 (eight) hours as needed for nausea or vomiting. 20 tablet 0  . Prenatal MV & Min w/FA-DHA (PRENATAL ADULT GUMMY/DHA/FA PO) Take by mouth.    . Pyridoxine HCl (VITAMIN B6 PO) Take by mouth.    . ondansetron (ZOFRAN-ODT) 4 MG disintegrating tablet Take 1 tablet (4 mg total) by mouth every 8 (eight) hours as needed for nausea or vomiting. 20 tablet 0    Allergies  Allergen Reactions  . Latex Other (See Comments)  . Tape Rash     ROS:  Review of Systems - Negative except what is noted in HPI. Denies vaginal bleeding, contractions, and notes good fetal movement. Also denies vomiting, fever, chills, or urinary symptoms.    Physical Exam:  Blood pressure 131/73, pulse (!) 109, temperature 97.8 F (36.6 C), temperature source Oral, resp. rate 18, height 5\' 4"  (1.626 m), weight 93.3 kg, last menstrual period 03/14/2020. General appearance: alert and no acute distress. Abdomen: soft, non-tender; bowel sounds normal; no masses,  no organomegaly Pelvic: deferred Extremities:  extremities normal, atraumatic, no cyanosis or edema Neurologic: grossly intact  NST INTERPRETATION: Indications: rule out uterine contractions  Mode: External Baseline Rate (A): 135 bpm Variability: Moderate, Minimal Accelerations: 15 x 15 Decelerations: None     Contraction Frequency (min): none  Impression: reactive   Assessment:  30 y.o. G1P0000 at [redacted]w[redacted]d with:  1.  Abdominal pain in pregnancy 2.  Nausea   Plan:  1. Patient given Tylenol 650 mg for pain.  2. Given a dose of Reglan for breakthrough nausea.  3. Can d/c home once nausea and pain improves. To f/u for next regularly scheduled OB appointment.    Rubie Maid, MD [facility]

## 2020-09-20 NOTE — OB Triage Note (Signed)
Pt [redacted]w[redacted]d G1P0 presents to birthplace w/ c/o abdominal pain. States this afternoon started having abdominal consisting of tightening for about 10 min at a time rating 8/10 pain coming and going accompanied by nausea. States currently 2/10 pain while not having tightening. Reports no LOF, no vag bleeding, and positive fetal movement. VSS.

## 2020-09-21 DIAGNOSIS — O26892 Other specified pregnancy related conditions, second trimester: Secondary | ICD-10-CM

## 2020-09-21 DIAGNOSIS — Z3A26 26 weeks gestation of pregnancy: Secondary | ICD-10-CM | POA: Diagnosis not present

## 2020-09-21 DIAGNOSIS — Z3490 Encounter for supervision of normal pregnancy, unspecified, unspecified trimester: Secondary | ICD-10-CM

## 2020-09-21 DIAGNOSIS — R110 Nausea: Secondary | ICD-10-CM

## 2020-09-21 DIAGNOSIS — R109 Unspecified abdominal pain: Secondary | ICD-10-CM

## 2020-09-21 NOTE — OB Triage Note (Signed)
Pt being discharged home with no complaints. VSS. Discharge instructions explained, verbal teach back method used. Pt understands when to come back or call.

## 2020-09-30 ENCOUNTER — Telehealth: Payer: Self-pay

## 2020-09-30 NOTE — Telephone Encounter (Signed)
Pt called the office stating that she was having swelling in her feet and legs and they were really big. Pt was advised to elevate her feet and legs when she sits, use compression socks to help with blood flow, decrease salt intake, check blood pressure every 3 hours any bp over 140/90 please go to ED or noticed face swelling leaking of fluids or severe cramping or pain in the abd area. Pt voiced that she understood. Information concerning swelling and preeclampsia sent to pt via mychart. Pt was advised to read over the information.

## 2020-10-04 ENCOUNTER — Other Ambulatory Visit: Payer: BC Managed Care – PPO

## 2020-10-04 ENCOUNTER — Ambulatory Visit (INDEPENDENT_AMBULATORY_CARE_PROVIDER_SITE_OTHER): Payer: BC Managed Care – PPO

## 2020-10-04 ENCOUNTER — Other Ambulatory Visit: Payer: Self-pay

## 2020-10-04 ENCOUNTER — Encounter: Payer: Self-pay | Admitting: Obstetrics and Gynecology

## 2020-10-04 ENCOUNTER — Ambulatory Visit (INDEPENDENT_AMBULATORY_CARE_PROVIDER_SITE_OTHER): Payer: BC Managed Care – PPO | Admitting: Obstetrics and Gynecology

## 2020-10-04 VITALS — BP 122/83 | HR 99 | Wt 226.0 lb

## 2020-10-04 DIAGNOSIS — Z23 Encounter for immunization: Secondary | ICD-10-CM | POA: Diagnosis not present

## 2020-10-04 DIAGNOSIS — Z3402 Encounter for supervision of normal first pregnancy, second trimester: Secondary | ICD-10-CM

## 2020-10-04 DIAGNOSIS — O358XX0 Maternal care for other (suspected) fetal abnormality and damage, not applicable or unspecified: Secondary | ICD-10-CM

## 2020-10-04 DIAGNOSIS — O35BXX0 Maternal care for other (suspected) fetal abnormality and damage, fetal cardiac anomalies, not applicable or unspecified: Secondary | ICD-10-CM

## 2020-10-04 DIAGNOSIS — R9389 Abnormal findings on diagnostic imaging of other specified body structures: Secondary | ICD-10-CM

## 2020-10-04 DIAGNOSIS — Z3A28 28 weeks gestation of pregnancy: Secondary | ICD-10-CM

## 2020-10-04 LAB — POCT URINALYSIS DIPSTICK OB
Bilirubin, UA: NEGATIVE
Blood, UA: NEGATIVE
Glucose, UA: NEGATIVE
Ketones, UA: NEGATIVE
Leukocytes, UA: NEGATIVE
Nitrite, UA: NEGATIVE
Spec Grav, UA: 1.01 (ref 1.010–1.025)
Urobilinogen, UA: 0.2 E.U./dL
pH, UA: 7 (ref 5.0–8.0)

## 2020-10-04 MED ORDER — RHO D IMMUNE GLOBULIN 1500 UNITS IM SOSY
1500.0000 [IU] | PREFILLED_SYRINGE | Freq: Once | INTRAMUSCULAR | Status: AC
  Administered 2020-10-04: 1500 [IU] via INTRAMUSCULAR

## 2020-10-04 NOTE — Addendum Note (Signed)
Addended by: Durwin Glaze on: 10/04/2020 01:52 PM   Modules accepted: Orders

## 2020-10-04 NOTE — Progress Notes (Signed)
ROB: 1 hour GCT today.  Patient had significant weight gain since last visit -21 pounds.  Eating between meals eating in the evening and soda intake discussed.  FAS done today reveals persistent EIF.  Possible lemon sign noted.  Patient had normal cell free DNA but did not have AFP performed.  Refer to MFM for further evaluation.  All of the above discussed in detail.  Questions answered.

## 2020-10-05 LAB — CBC
Hematocrit: 32.8 % — ABNORMAL LOW (ref 34.0–46.6)
Hemoglobin: 10.9 g/dL — ABNORMAL LOW (ref 11.1–15.9)
MCH: 32.2 pg (ref 26.6–33.0)
MCHC: 33.2 g/dL (ref 31.5–35.7)
MCV: 97 fL (ref 79–97)
Platelets: 214 10*3/uL (ref 150–450)
RBC: 3.39 x10E6/uL — ABNORMAL LOW (ref 3.77–5.28)
RDW: 11.9 % (ref 11.7–15.4)
WBC: 10.3 10*3/uL (ref 3.4–10.8)

## 2020-10-05 LAB — RPR: RPR Ser Ql: NONREACTIVE

## 2020-10-05 LAB — GLUCOSE, 1 HOUR GESTATIONAL: Gestational Diabetes Screen: 107 mg/dL (ref 65–139)

## 2020-10-06 ENCOUNTER — Other Ambulatory Visit: Payer: Self-pay | Admitting: Obstetrics and Gynecology

## 2020-10-06 ENCOUNTER — Other Ambulatory Visit: Payer: Self-pay

## 2020-10-06 ENCOUNTER — Ambulatory Visit: Payer: BC Managed Care – PPO | Attending: Obstetrics and Gynecology

## 2020-10-06 DIAGNOSIS — O99213 Obesity complicating pregnancy, third trimester: Secondary | ICD-10-CM

## 2020-10-06 DIAGNOSIS — Z3A28 28 weeks gestation of pregnancy: Secondary | ICD-10-CM | POA: Insufficient documentation

## 2020-10-06 DIAGNOSIS — O35BXX5 Maternal care for other (suspected) fetal abnormality and damage, fetal cardiac anomalies, fetus 5: Secondary | ICD-10-CM

## 2020-10-06 DIAGNOSIS — E669 Obesity, unspecified: Secondary | ICD-10-CM | POA: Insufficient documentation

## 2020-10-06 DIAGNOSIS — O358XX0 Maternal care for other (suspected) fetal abnormality and damage, not applicable or unspecified: Secondary | ICD-10-CM | POA: Diagnosis not present

## 2020-10-19 ENCOUNTER — Encounter: Payer: Self-pay | Admitting: Obstetrics and Gynecology

## 2020-10-19 ENCOUNTER — Other Ambulatory Visit: Payer: Self-pay

## 2020-10-19 ENCOUNTER — Telehealth: Payer: Self-pay

## 2020-10-19 ENCOUNTER — Ambulatory Visit (INDEPENDENT_AMBULATORY_CARE_PROVIDER_SITE_OTHER): Payer: BC Managed Care – PPO | Admitting: Obstetrics and Gynecology

## 2020-10-19 VITALS — BP 119/79 | HR 88 | Wt 230.0 lb

## 2020-10-19 DIAGNOSIS — Z3403 Encounter for supervision of normal first pregnancy, third trimester: Secondary | ICD-10-CM

## 2020-10-19 DIAGNOSIS — Z3A30 30 weeks gestation of pregnancy: Secondary | ICD-10-CM

## 2020-10-19 NOTE — Telephone Encounter (Signed)
Pt was checking out and wanted to know if the request for her breast pump was placed. I told the pt I will send a message and the nurse will be in contact with her. Please advise

## 2020-10-19 NOTE — Progress Notes (Signed)
ROB: Patient had MFM consult-no evidence of fetal abnormality.  She reports daily fetal movement.  Feels like the baby has turned from breech to a different position.  She continues to experience nausea daily if she does not take nighttime Diclegis.  Reports not sleeping well.  Has seen neurology for migraine headaches.  Over-the-counter medications recommended.

## 2020-10-20 NOTE — Telephone Encounter (Signed)
error 

## 2020-10-20 NOTE — Telephone Encounter (Signed)
Breast pump form faxed in.

## 2020-11-02 ENCOUNTER — Encounter: Payer: Self-pay | Admitting: Obstetrics and Gynecology

## 2020-11-02 ENCOUNTER — Other Ambulatory Visit: Payer: Self-pay

## 2020-11-02 ENCOUNTER — Ambulatory Visit (INDEPENDENT_AMBULATORY_CARE_PROVIDER_SITE_OTHER): Payer: BC Managed Care – PPO | Admitting: Obstetrics and Gynecology

## 2020-11-02 ENCOUNTER — Telehealth: Payer: Self-pay

## 2020-11-02 VITALS — BP 120/79 | HR 103 | Wt 232.2 lb

## 2020-11-02 DIAGNOSIS — G43109 Migraine with aura, not intractable, without status migrainosus: Secondary | ICD-10-CM

## 2020-11-02 DIAGNOSIS — O99343 Other mental disorders complicating pregnancy, third trimester: Secondary | ICD-10-CM

## 2020-11-02 DIAGNOSIS — Z3403 Encounter for supervision of normal first pregnancy, third trimester: Secondary | ICD-10-CM

## 2020-11-02 DIAGNOSIS — F32A Depression, unspecified: Secondary | ICD-10-CM

## 2020-11-02 DIAGNOSIS — Z3A32 32 weeks gestation of pregnancy: Secondary | ICD-10-CM

## 2020-11-02 DIAGNOSIS — F419 Anxiety disorder, unspecified: Secondary | ICD-10-CM

## 2020-11-02 LAB — POCT URINALYSIS DIPSTICK OB
Bilirubin, UA: NEGATIVE
Blood, UA: NEGATIVE
Glucose, UA: NEGATIVE
Leukocytes, UA: NEGATIVE
Nitrite, UA: NEGATIVE
Spec Grav, UA: 1.025 (ref 1.010–1.025)
Urobilinogen, UA: 0.2 E.U./dL
pH, UA: 6 (ref 5.0–8.0)

## 2020-11-02 MED ORDER — SERTRALINE HCL 25 MG PO TABS: 25.0000 mg | ORAL_TABLET | Freq: Every day | ORAL | 1 refills | Status: DC

## 2020-11-02 NOTE — Progress Notes (Signed)
ROB: Patient c/o of feeling her belly "vibrate" at times, as if her baby is having a seizure. Vibrations typically occur 1-3 times daily for the past 3 days, usually last for 30-60 seconds.  Reports being seen by Neurologist, who recommended starting C0-Q-10, magnesium, and B-2 for management of her migraines.  Just started this 2 weeks ago. She had recent ultrasound last month with MFM for fetal head "lemon sign" noted on ultrasound and EIF, deemed normal. Discussed that it could possibly be the supplementation causing increased activity in the fetus, but if it continues, is prolonged or excessive, can consider f/u consultation with MFM.   Additionally, patient notes that she has been more tearful lately, thinks she may be experiencing some depression symptoms.  Is on Buspar for her anxiety and notes she feels it is overall managed.  Discussed options for management, can refer for counseling or can start medications, patient ok to start medication. Will start on Zoloft 25 mg. PHQ-9 score 9 and GAD-7 score 9. To f/u in 2 weeks.

## 2020-11-02 NOTE — Patient Instructions (Signed)

## 2020-11-02 NOTE — Telephone Encounter (Signed)
Pt was checking out and stated that she got a bill from [facility] and that she has contacted [facility]. [facility] stated that we [facility] can transfer that balance to the bill she owes at [facility]. I told the pt that her ob payment was paid and that money sits in her account until she delivers. The pt said that she was wanting the have that money transferred. I told her that I will send a message to my manager and that she will be in contact with her.

## 2020-11-02 NOTE — Progress Notes (Signed)
ROB-Pt present for routine prenatal care.  Pt stated that she has noticed that the baby is shaking like a having a seizure for 20-30 seconds throughout the day and mid back pain. GAD-7= 9 PHQ-9=9.

## 2020-11-04 NOTE — Telephone Encounter (Signed)
Pt aware I have sent a email to [facility] asking if we can transfer her ob payments to her L & D visit.   Will f/u in 1 week.

## 2020-11-08 ENCOUNTER — Telehealth: Payer: Self-pay

## 2020-11-08 NOTE — Telephone Encounter (Signed)
Spoke with patient and she stated that she has been having nausea, body aches, SOB, rapid HR, and feels like she is running a fever since yesterday. Denies cough, runny nose, and other cold symptoms. I have advised patient to go have a Covid test done. 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 I told her that if they can't get her to go to [facility]. Patient verbalized understanding.

## 2020-11-08 NOTE — Telephone Encounter (Signed)
Pt called in and stated that she feels sick but she doesn't have a a fever. The pt said that she does feel nauseas but No pain no diarrhea. The pt said that she checked her Blood pressure it was 135-/82. The pt is requesting a call back. She said she doesn't know if she should come in to be seen. Please advise

## 2020-11-09 NOTE — Telephone Encounter (Signed)
Called patient to check on her and see how she was feeling today. LM for patient that she does not have to call back was just checking on her.

## 2020-11-11 ENCOUNTER — Telehealth: Payer: Self-pay

## 2020-11-11 NOTE — Telephone Encounter (Signed)
Pt called in and stated that she was recently tested for covid the pt got a negative result back. The pt is stating that she is having shortness of breath and and super tired. The pt was requesting a call back. Pt denies any pain.  Please advise

## 2020-11-11 NOTE — Telephone Encounter (Signed)
Pt states she is still not feeling well and having intermittent SOB. - Covid test.  She called this morning at 9:15 no response form nurse. She also sent a my chart message this afternoon and was advised to go to ED.   Dicussed with  [facility] - advised to go to L& D.   Pt voiced understanding. Not happy about it.

## 2020-11-14 ENCOUNTER — Ambulatory Visit (INDEPENDENT_AMBULATORY_CARE_PROVIDER_SITE_OTHER): Payer: BC Managed Care – PPO | Admitting: Obstetrics and Gynecology

## 2020-11-14 ENCOUNTER — Encounter: Payer: Self-pay | Admitting: Obstetrics and Gynecology

## 2020-11-14 ENCOUNTER — Other Ambulatory Visit: Payer: Self-pay

## 2020-11-14 VITALS — BP 123/84 | HR 114 | Wt 232.8 lb

## 2020-11-14 DIAGNOSIS — Z3403 Encounter for supervision of normal first pregnancy, third trimester: Secondary | ICD-10-CM

## 2020-11-14 DIAGNOSIS — Z3A34 34 weeks gestation of pregnancy: Secondary | ICD-10-CM

## 2020-11-14 DIAGNOSIS — F32A Depression, unspecified: Secondary | ICD-10-CM

## 2020-11-14 DIAGNOSIS — O99343 Other mental disorders complicating pregnancy, third trimester: Secondary | ICD-10-CM

## 2020-11-14 DIAGNOSIS — O26843 Uterine size-date discrepancy, third trimester: Secondary | ICD-10-CM

## 2020-11-14 NOTE — Progress Notes (Signed)
ROB:  C/o occ SOB.  Says constant fatigue.  Says she "over this pregnancy".  Zoloft "working at 25mg  dose".  We discussed the reasons for shortness of breath during pregnancy (diaphragmatic excursion, low hemoglobin, increased size/weight during pregnancy) we discussed the possibility of actual fatigue of pregnancy (weariness) versus the feeling of depression causing relative fatigue.  Patient will continue on Zoloft at 25 mg.  Expectant management of shortness of breath. Possible induction for BMI greater than 40 discussed.  Patient reports daily fetal movement.  Ultrasound for size greater than dates ordered at 36 weeks.  Cultures next visit.

## 2020-11-15 ENCOUNTER — Encounter: Payer: BC Managed Care – PPO | Admitting: Obstetrics and Gynecology

## 2020-11-18 NOTE — Telephone Encounter (Signed)
Hey Mandy Hughes, I am so sorry I thought I responded back to this email.  I'm still trying to catch up from being out.  I don't see it being a problem if you guys are ok with the transfer.     Thanks  [place] [facility] SW-Pro Fee Billing Pro Fee Claim Specialist II PH: [phone]   Pt aware that I let Mandy Hughes know that was fine. Pt aware that by transferring these funds she may still get a bill from [facility]. Pt voiced understanding.

## 2020-12-01 ENCOUNTER — Ambulatory Visit (INDEPENDENT_AMBULATORY_CARE_PROVIDER_SITE_OTHER): Payer: BC Managed Care – PPO | Admitting: Obstetrics and Gynecology

## 2020-12-01 ENCOUNTER — Other Ambulatory Visit: Payer: Self-pay

## 2020-12-01 ENCOUNTER — Encounter: Payer: Self-pay | Admitting: Obstetrics and Gynecology

## 2020-12-01 ENCOUNTER — Ambulatory Visit (INDEPENDENT_AMBULATORY_CARE_PROVIDER_SITE_OTHER): Payer: BC Managed Care – PPO

## 2020-12-01 VITALS — BP 113/69 | HR 104 | Wt 236.3 lb

## 2020-12-01 DIAGNOSIS — O26843 Uterine size-date discrepancy, third trimester: Secondary | ICD-10-CM

## 2020-12-01 DIAGNOSIS — Z0289 Encounter for other administrative examinations: Secondary | ICD-10-CM

## 2020-12-01 DIAGNOSIS — Z3A36 36 weeks gestation of pregnancy: Secondary | ICD-10-CM

## 2020-12-01 DIAGNOSIS — Z3403 Encounter for supervision of normal first pregnancy, third trimester: Secondary | ICD-10-CM

## 2020-12-01 LAB — POCT URINALYSIS DIPSTICK OB
Bilirubin, UA: NEGATIVE
Blood, UA: NEGATIVE
Glucose, UA: NEGATIVE
Ketones, UA: NEGATIVE
Leukocytes, UA: NEGATIVE
Nitrite, UA: NEGATIVE
Spec Grav, UA: 1.01 (ref 1.010–1.025)
Urobilinogen, UA: 0.2 E.U./dL
pH, UA: 7 (ref 5.0–8.0)

## 2020-12-01 MED ORDER — ONDANSETRON 4 MG PO TBDP
4.0000 mg | ORAL_TABLET | Freq: Three times a day (TID) | ORAL | 0 refills | Status: DC | PRN
Start: 2020-12-01 — End: 2020-12-19

## 2020-12-01 NOTE — Progress Notes (Signed)
ROB: Ultrasound today shows 79th percentile growth.  Discussed in detail with patient.  BMI now over 40.  Induction scheduled for midnight 12/21.  Patient having occasional contractions daily.  GC/CT-GBS performed today.

## 2020-12-01 NOTE — Addendum Note (Signed)
Addended by: Durwin Glaze on: 12/01/2020 01:48 PM   Modules accepted: Orders

## 2020-12-02 ENCOUNTER — Ambulatory Visit (INDEPENDENT_AMBULATORY_CARE_PROVIDER_SITE_OTHER): Payer: BC Managed Care – PPO | Admitting: Obstetrics and Gynecology

## 2020-12-02 DIAGNOSIS — O99213 Obesity complicating pregnancy, third trimester: Secondary | ICD-10-CM | POA: Diagnosis present

## 2020-12-02 DIAGNOSIS — O368130 Decreased fetal movements, third trimester, not applicable or unspecified: Secondary | ICD-10-CM | POA: Diagnosis not present

## 2020-12-02 NOTE — Progress Notes (Signed)
Patient comes in today for decreased fetal movement. Per Dr. Amalia Hailey I had the patient come in to be put on NST. Strip was reactive per Dr. Amalia Hailey.

## 2020-12-03 LAB — STREP GP B NAA: Strep Gp B NAA: NEGATIVE

## 2020-12-04 LAB — GC/CHLAMYDIA PROBE AMP
Chlamydia trachomatis, NAA: NEGATIVE
Neisseria Gonorrhoeae by PCR: NEGATIVE

## 2020-12-07 ENCOUNTER — Ambulatory Visit (INDEPENDENT_AMBULATORY_CARE_PROVIDER_SITE_OTHER): Payer: BC Managed Care – PPO | Admitting: Obstetrics and Gynecology

## 2020-12-07 ENCOUNTER — Encounter: Payer: Self-pay | Admitting: Obstetrics and Gynecology

## 2020-12-07 ENCOUNTER — Other Ambulatory Visit: Payer: Self-pay

## 2020-12-07 VITALS — BP 101/69 | HR 91 | Wt 240.9 lb

## 2020-12-07 DIAGNOSIS — O99210 Obesity complicating pregnancy, unspecified trimester: Secondary | ICD-10-CM

## 2020-12-07 DIAGNOSIS — Z3A37 37 weeks gestation of pregnancy: Secondary | ICD-10-CM

## 2020-12-07 DIAGNOSIS — Z3403 Encounter for supervision of normal first pregnancy, third trimester: Secondary | ICD-10-CM

## 2020-12-07 LAB — POCT URINALYSIS DIPSTICK OB
Bilirubin, UA: NEGATIVE
Blood, UA: NEGATIVE
Glucose, UA: NEGATIVE
Ketones, UA: NEGATIVE
Leukocytes, UA: NEGATIVE
Nitrite, UA: NEGATIVE
POC,PROTEIN,UA: NEGATIVE
Spec Grav, UA: 1.01 (ref 1.010–1.025)
Urobilinogen, UA: 0.2 E.U./dL
pH, UA: 6 (ref 5.0–8.0)

## 2020-12-07 NOTE — Progress Notes (Signed)
ROB-Pt present for routine prenatal care. Pt stated having pelvic/vaginal pressure.

## 2020-12-07 NOTE — Progress Notes (Signed)
ROB: Complains of increased swelling in legs and some calf-pain in the evenings.  Is elevating legs. Unable to use compression stockings as she has difficulties getting them up over her legs. Had NST on Friday for decreased fetal movement. Has had no issues since then. Is scheduled on 12/21 for IOL due to increased BMI.  Answered patient's questions regarding delayed cord clamping, reviewed skin to skin. Declined vaginal exam, but does note pelvic pressure. RTC in 1 week.

## 2020-12-13 ENCOUNTER — Encounter: Payer: Self-pay | Admitting: Surgical

## 2020-12-13 ENCOUNTER — Ambulatory Visit (INDEPENDENT_AMBULATORY_CARE_PROVIDER_SITE_OTHER): Payer: BC Managed Care – PPO | Admitting: Obstetrics and Gynecology

## 2020-12-13 ENCOUNTER — Other Ambulatory Visit: Payer: Self-pay

## 2020-12-13 VITALS — BP 115/76 | HR 108 | Wt 238.0 lb

## 2020-12-13 DIAGNOSIS — Z3A38 38 weeks gestation of pregnancy: Secondary | ICD-10-CM

## 2020-12-13 DIAGNOSIS — O99210 Obesity complicating pregnancy, unspecified trimester: Secondary | ICD-10-CM

## 2020-12-13 DIAGNOSIS — Z3403 Encounter for supervision of normal first pregnancy, third trimester: Secondary | ICD-10-CM

## 2020-12-13 NOTE — Progress Notes (Signed)
ROB: Complains of back discomfort.  Occasional contractions.  Induction next week.  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  No further issues with fetal movement

## 2020-12-16 ENCOUNTER — Inpatient Hospital Stay
Admission: EM | Admit: 2020-12-16 | Discharge: 2020-12-19 | DRG: 807 | Disposition: A | Discharge status: Home / Self Care | Payer: BC Managed Care – PPO | Attending: Obstetrics and Gynecology | Admitting: Obstetrics and Gynecology

## 2020-12-16 ENCOUNTER — Ambulatory Visit (INDEPENDENT_AMBULATORY_CARE_PROVIDER_SITE_OTHER): Payer: BC Managed Care – PPO | Admitting: Obstetrics and Gynecology

## 2020-12-16 ENCOUNTER — Encounter: Payer: Self-pay | Admitting: Obstetrics and Gynecology

## 2020-12-16 ENCOUNTER — Other Ambulatory Visit: Payer: Self-pay

## 2020-12-16 VITALS — BP 139/79 | HR 120 | Wt 235.8 lb

## 2020-12-16 DIAGNOSIS — O9902 Anemia complicating childbirth: Secondary | ICD-10-CM | POA: Diagnosis present

## 2020-12-16 DIAGNOSIS — Z3A38 38 weeks gestation of pregnancy: Secondary | ICD-10-CM

## 2020-12-16 DIAGNOSIS — Z20822 Contact with and (suspected) exposure to covid-19: Secondary | ICD-10-CM | POA: Diagnosis present

## 2020-12-16 DIAGNOSIS — O99213 Obesity complicating pregnancy, third trimester: Secondary | ICD-10-CM

## 2020-12-16 DIAGNOSIS — O26899 Other specified pregnancy related conditions, unspecified trimester: Secondary | ICD-10-CM

## 2020-12-16 DIAGNOSIS — D649 Anemia, unspecified: Secondary | ICD-10-CM | POA: Diagnosis present

## 2020-12-16 DIAGNOSIS — F419 Anxiety disorder, unspecified: Secondary | ICD-10-CM | POA: Diagnosis present

## 2020-12-16 DIAGNOSIS — O4202 Full-term premature rupture of membranes, onset of labor within 24 hours of rupture: Secondary | ICD-10-CM | POA: Diagnosis not present

## 2020-12-16 DIAGNOSIS — Z3403 Encounter for supervision of normal first pregnancy, third trimester: Secondary | ICD-10-CM | POA: Diagnosis not present

## 2020-12-16 DIAGNOSIS — O99344 Other mental disorders complicating childbirth: Secondary | ICD-10-CM | POA: Diagnosis present

## 2020-12-16 DIAGNOSIS — Z87891 Personal history of nicotine dependence: Secondary | ICD-10-CM | POA: Diagnosis not present

## 2020-12-16 DIAGNOSIS — Z8659 Personal history of other mental and behavioral disorders: Secondary | ICD-10-CM | POA: Diagnosis present

## 2020-12-16 DIAGNOSIS — Z3A39 39 weeks gestation of pregnancy: Secondary | ICD-10-CM | POA: Diagnosis not present

## 2020-12-16 DIAGNOSIS — O26893 Other specified pregnancy related conditions, third trimester: Secondary | ICD-10-CM | POA: Diagnosis present

## 2020-12-16 DIAGNOSIS — O4292 Full-term premature rupture of membranes, unspecified as to length of time between rupture and onset of labor: Secondary | ICD-10-CM | POA: Diagnosis present

## 2020-12-16 DIAGNOSIS — Z3490 Encounter for supervision of normal pregnancy, unspecified, unspecified trimester: Secondary | ICD-10-CM

## 2020-12-16 DIAGNOSIS — E669 Obesity, unspecified: Secondary | ICD-10-CM | POA: Diagnosis not present

## 2020-12-16 DIAGNOSIS — Z6791 Unspecified blood type, Rh negative: Secondary | ICD-10-CM

## 2020-12-16 DIAGNOSIS — O99214 Obesity complicating childbirth: Secondary | ICD-10-CM | POA: Diagnosis present

## 2020-12-16 DIAGNOSIS — O4290 Premature rupture of membranes, unspecified as to length of time between rupture and onset of labor, unspecified weeks of gestation: Secondary | ICD-10-CM | POA: Diagnosis present

## 2020-12-16 DIAGNOSIS — O360130 Maternal care for anti-D [Rh] antibodies, third trimester, not applicable or unspecified: Secondary | ICD-10-CM | POA: Diagnosis not present

## 2020-12-16 LAB — CBC
HCT: 33 % — ABNORMAL LOW (ref 36.0–46.0)
Hemoglobin: 11.1 g/dL — ABNORMAL LOW (ref 12.0–15.0)
MCH: 31.9 pg (ref 26.0–34.0)
MCHC: 33.6 g/dL (ref 30.0–36.0)
MCV: 94.8 fL (ref 80.0–100.0)
Platelets: 210 10*3/uL (ref 150–400)
RBC: 3.48 MIL/uL — ABNORMAL LOW (ref 3.87–5.11)
RDW: 13.3 % (ref 11.5–15.5)
WBC: 13.2 10*3/uL — ABNORMAL HIGH (ref 4.0–10.5)
nRBC: 0 % (ref 0.0–0.2)

## 2020-12-16 LAB — RESP PANEL BY RT-PCR (FLU A&B, COVID) ARPGX2
Influenza A by PCR: NEGATIVE
Influenza B by PCR: NEGATIVE
SARS Coronavirus 2 by RT PCR: NEGATIVE

## 2020-12-16 LAB — POCT URINALYSIS DIPSTICK OB
Bilirubin, UA: NEGATIVE
Glucose, UA: NEGATIVE
Leukocytes, UA: NEGATIVE
Nitrite, UA: NEGATIVE
POC,PROTEIN,UA: NEGATIVE
Spec Grav, UA: 1.01 (ref 1.010–1.025)
Urobilinogen, UA: 0.2 E.U./dL
pH, UA: 7 (ref 5.0–8.0)

## 2020-12-16 LAB — TYPE AND SCREEN
ABO/RH(D): O NEG
Antibody Screen: POSITIVE

## 2020-12-16 LAB — ABO/RH: ABO/RH(D): O NEG

## 2020-12-16 MED ORDER — ACETAMINOPHEN 325 MG PO TABS
650.0000 mg | ORAL_TABLET | ORAL | Status: DC | PRN
  Administered 2020-12-17 (×2): 650 mg via ORAL
  Filled 2020-12-16 (×2): qty 2

## 2020-12-16 MED ORDER — OXYTOCIN-SODIUM CHLORIDE 30-0.9 UT/500ML-% IV SOLN
2.5000 [IU]/h | INTRAVENOUS | Status: DC
  Administered 2020-12-17: 2.5 [IU]/h via INTRAVENOUS
  Filled 2020-12-16 (×2): qty 500

## 2020-12-16 MED ORDER — AMMONIA AROMATIC IN INHA
RESPIRATORY_TRACT | Status: AC
  Filled 2020-12-16: qty 10

## 2020-12-16 MED ORDER — OXYTOCIN 10 UNIT/ML IJ SOLN
INTRAMUSCULAR | Status: AC
  Filled 2020-12-16: qty 2

## 2020-12-16 MED ORDER — LACTATED RINGERS IV SOLN: INTRAVENOUS | Status: DC

## 2020-12-16 MED ORDER — MISOPROSTOL 200 MCG PO TABS
ORAL_TABLET | ORAL | Status: AC
  Administered 2020-12-16: 50 ug via VAGINAL
  Filled 2020-12-16: qty 4

## 2020-12-16 MED ORDER — LACTATED RINGERS IV SOLN
500.0000 mL | INTRAVENOUS | Status: DC | PRN
  Administered 2020-12-17: 1000 mL via INTRAVENOUS

## 2020-12-16 MED ORDER — SOD CITRATE-CITRIC ACID 500-334 MG/5ML PO SOLN: 30.0000 mL | ORAL | Status: DC | PRN

## 2020-12-16 MED ORDER — OXYTOCIN BOLUS FROM INFUSION
333.0000 mL | Freq: Once | INTRAVENOUS | Status: DC
  Administered 2020-12-17: 333 mL via INTRAVENOUS

## 2020-12-16 MED ORDER — ONDANSETRON HCL 4 MG/2ML IJ SOLN
4.0000 mg | Freq: Four times a day (QID) | INTRAMUSCULAR | Status: DC | PRN
  Administered 2020-12-17 (×3): 4 mg via INTRAVENOUS
  Filled 2020-12-16 (×4): qty 2

## 2020-12-16 MED ORDER — LIDOCAINE HCL (PF) 1 % IJ SOLN: 30.0000 mL | INTRAMUSCULAR | Status: DC | PRN

## 2020-12-16 MED ORDER — BUTORPHANOL TARTRATE 1 MG/ML IJ SOLN
1.0000 mg | INTRAMUSCULAR | Status: DC | PRN
Start: 2020-12-16 — End: 2020-12-17
  Administered 2020-12-16 – 2020-12-17 (×5): 1 mg via INTRAVENOUS
  Filled 2020-12-16 (×5): qty 1

## 2020-12-16 MED ORDER — MISOPROSTOL 50MCG HALF TABLET
50.0000 ug | ORAL_TABLET | ORAL | Status: DC | PRN
  Administered 2020-12-16: 50 ug via VAGINAL
  Filled 2020-12-16 (×4): qty 1

## 2020-12-16 MED ORDER — LIDOCAINE HCL (PF) 1 % IJ SOLN
INTRAMUSCULAR | Status: AC
  Filled 2020-12-16: qty 30

## 2020-12-16 MED ORDER — OXYTOCIN-SODIUM CHLORIDE 30-0.9 UT/500ML-% IV SOLN
1.0000 m[IU]/min | INTRAVENOUS | Status: DC
  Administered 2020-12-17: 1 m[IU]/min via INTRAVENOUS

## 2020-12-16 NOTE — Progress Notes (Signed)
Patient comes in today due to having several gushes of fluids this morning. Not having a lot of pressure this morning. Had a couple of contractions on the way to the office. They was more than 5 minutes apart.

## 2020-12-16 NOTE — Progress Notes (Signed)
Intrapartum Progress Note  S: Patient noting more pain with contractions  O: Blood pressure 135/81, pulse (!) 105, temperature 98.3 F (36.8 C), temperature source Oral, resp. rate 19, height 5\' 3"  (1.6 m), weight 106.6 kg, last menstrual period 03/14/2020. Gen App: NAD, comfortable Abdomen: soft, gravid FHT: baseline 140 bpm.  Accels present.  Decels present - late decelerations (shallow) from 2024 to 2048, resolved with position change. moderate in degree variability.   Tocometer: contractions q irreuglar, q 2-3 minutes Cervix: 1/60/-3 Extremities: Nontender, no edema.   Labs: No new labs   Assessment:  1: SIUP at 107w6d 2. PROM 3. Rh negative  Plan:  1. Continue IOL for PROM.  Is s/p 2nd dose of Cytotec. Next dose is due at 12:15 a.m.  2. For Rhogam postpartum, as indicated.    Rubie Maid, MD 12/16/2020 10:39 PM

## 2020-12-16 NOTE — H&P (Signed)
Obstetric History and Physical  Mandy Hughes is a 30 y.o. G1P0000 with IUP at [redacted]w[redacted]d presenting for admission for PROM since 0915 this morning. Patient states she has been having  irregular contractions, no vaginal bleeding, ruptured, clear fluid membranes, with active fetal movement.    Prenatal Course Source of Care: [facility]  with onset of care at 9 weeks Pregnancy complications or risks: Patient Active Problem List   Diagnosis Date Noted  . Term pregnancy 12/16/2020  . PROM (premature rupture of membranes) 12/16/2020  . Pregnancy 09/21/2020  . Nausea 09/20/2020  . Anxiety during pregnancy 09/09/2020  . Echogenic focus of heart of fetus affecting antepartum care of mother 09/09/2020  . Rh negative state in antepartum period 07/12/2020   She plans to breastfeed She desires oral contraceptives (estrogen/progesterone) for postpartum contraception.   Prenatal labs and studies: ABO, Rh: --/--/O NEG Performed at [facility], [address] [place], [place] [address]  [phone]) Antibody: POS (12/17 1448) Rubella: 3.36 (05/20 1607) RPR: Non Reactive (10/05 1014)  HBsAg: Negative (05/20 1607)  HIV: Non Reactive (05/20 1607)  NOM:VEHMCNOB/-- (12/02 1358) 1 hr Glucola  normal Genetic screening normal Anatomy US normal   Past Medical History:  Diagnosis Date  . Anxiety   . Cancer ([place])    basal cell carcinoma  . Depression     Past Surgical History:  Procedure Laterality Date  . LIPOMA EXCISION Left 2017   shoulder  . SKIN CANCER EXCISION      OB History  Gravida Para Term Preterm AB Living  1 0 0 0 0 0  SAB IAB Ectopic Multiple Live Births  0 0 0 0 0    # Outcome Date GA Lbr Len/2nd Weight Sex Delivery Anes PTL Lv  1 Current             Social History   Socioeconomic History  . Marital status: Married    Spouse name: Eddie Dibbles  . Number of children: Not on file  . Years of education: Not on file  . Highest education level: Not on  file  Occupational History  . Occupation: Government social research officer  Tobacco Use  . Smoking status: Former Smoker    Quit date: 07/13/2017    Years since quitting: 3.4  . Smokeless tobacco: Never Used  Vaping Use  . Vaping Use: Never used  Substance and Sexual Activity  . Alcohol use: Not Currently    Comment: rarely  . Drug use: No  . Sexual activity: Not Currently    Birth control/protection: Other-see comments    Comment: undecided  Other Topics Concern  . Not on file  Social History Narrative  . Not on file   Social Determinants of Health   Financial Resource Strain: Not on file  Food Insecurity: Not on file  Transportation Needs: Not on file  Physical Activity: Not on file  Stress: Not on file  Social Connections: Not on file    Family History  Problem Relation Age of Onset  . Seizures Father        Stepdad  . Cancer Maternal Grandfather   . Migraines Sister   . Bipolar disorder Sister   . Personality disorder Sister     Medications Prior to Admission  Medication Sig Dispense Refill Last Dose  . acetaminophen (TYLENOL) 500 MG tablet Take 500 mg by mouth every 6 (six) hours as needed.     . busPIRone (BUSPAR) 15 MG tablet Take 15 mg by mouth  3 (three) times daily. (Patient not taking: Reported on 12/16/2020)     . docusate sodium (COLACE) 100 MG capsule Take 100 mg by mouth 2 (two) times daily.     . Doxylamine-Pyridoxine (DICLEGIS) 10-10 MG TBEC Take 2 tablets by mouth at bedtime. If symptoms persist, add one tablet in the morning and one in the afternoon 100 tablet 5   . hydrOXYzine (ATARAX/VISTARIL) 25 MG tablet Take 1 tablet (25 mg total) by mouth every 6 (six) hours as needed for itching. (Patient not taking: Reported on 12/16/2020) 30 tablet 0   . ondansetron (ZOFRAN) 4 MG tablet Take 1 tablet (4 mg total) by mouth every 8 (eight) hours as needed for nausea or vomiting. 20 tablet 0   . ondansetron (ZOFRAN-ODT) 4 MG disintegrating tablet Take 1 tablet (4 mg total) by  mouth every 8 (eight) hours as needed for nausea or vomiting. 20 tablet 0   . Prenatal MV & Min w/FA-DHA (PRENATAL ADULT GUMMY/DHA/FA PO) Take by mouth.     . sertraline (ZOLOFT) 25 MG tablet Take 1 tablet (25 mg total) by mouth daily. 90 tablet 1     Allergies  Allergen Reactions  . Latex Other (See Comments)  . Tape Rash    Review of Systems: Negative except for what is mentioned in HPI.  Physical Exam: BP 135/81 (BP Location: Right Arm)   Pulse (!) 105   Temp 98.3 F (36.8 C) (Oral)   Resp 19   Ht 5\' 3"  (1.6 m)   Wt 106.6 kg   LMP 03/14/2020   BMI 41.63 kg/m  CONSTITUTIONAL: Well-developed, well-nourished female in no acute distress.  HENT:  Normocephalic, atraumatic, External right and left ear normal. Oropharynx is clear and moist EYES: Conjunctivae and EOM are normal. Pupils are equal, round, and reactive to light. No scleral icterus.  NECK: Normal range of motion, supple, no masses SKIN: Skin is warm and dry. No rash noted. Not diaphoretic. No erythema. No pallor. NEUROLOGIC: Alert and oriented to person, place, and time. Normal reflexes, muscle tone coordination. No cranial nerve deficit noted. PSYCHIATRIC: Normal mood and affect. Normal behavior. Normal judgment and thought content. CARDIOVASCULAR: Normal heart rate noted, regular rhythm RESPIRATORY: Effort and breath sounds normal, no problems with respiration noted ABDOMEN: Soft, nontender, nondistended, gravid. MUSCULOSKELETAL: Normal range of motion. No edema and no tenderness. 2+ distal pulses.  Cervical Exam: Dilatation 1 cm   Effacement 60%  Station -3   Presentation: cephalic FHT:  Baseline rate 140 bpm   Variability moderate  Accelerations present   Decelerations none Contractions: Every 2-3 mins   Pertinent Labs/Studies:   Results for orders placed or performed during the hospital encounter of 12/16/20 (from the past 24 hour(s))  CBC     Status: Abnormal   Collection Time: 12/16/20  2:48 PM  Result  Value Ref Range   WBC 13.2 (H) 4.0 - 10.5 K/uL   RBC 3.48 (L) 3.87 - 5.11 MIL/uL   Hemoglobin 11.1 (L) 12.0 - 15.0 g/dL   HCT 33.0 (L) 36.0 - 46.0 %   MCV 94.8 80.0 - 100.0 fL   MCH 31.9 26.0 - 34.0 pg   MCHC 33.6 30.0 - 36.0 g/dL   RDW 13.3 11.5 - 15.5 %   Platelets 210 150 - 400 K/uL   nRBC 0.0 0.0 - 0.2 %  Type and screen     Status: None   Collection Time: 12/16/20  2:48 PM  Result Value Ref Range   ABO/RH(D) Jenetta Downer  NEG    Antibody Screen POS    Sample Expiration 12/19/2020,2359    Antibody Identification      PASSIVELY ACQUIRED ANTI-D Performed at [facility], [address] [place], [place] [address]   Resp Panel by RT-PCR (Flu A&B, Covid) Nasopharyngeal Swab     Status: None   Collection Time: 12/16/20  2:55 PM   Specimen: Nasopharyngeal Swab; Nasopharyngeal(NP) swabs in vial transport medium  Result Value Ref Range   SARS Coronavirus 2 by RT PCR NEGATIVE NEGATIVE   Influenza A by PCR NEGATIVE NEGATIVE   Influenza B by PCR NEGATIVE NEGATIVE  ABO/Rh     Status: None   Collection Time: 12/16/20  4:16 PM  Result Value Ref Range   ABO/RH(D)      Jenetta Downer NEG Performed at [facility], [address], [place], [place] [address]     Assessment : Shenea Giacobbe is a 30 y.o. G1P0000 at [redacted]w[redacted]d being induction of labor due to PROM. Rh negative.  Morbid obesity in pregnancy.  Anxiety.   Plan: Labor: Induction as ordered with Cytotec as ordered as per protocol. Analgesia as needed. FWB: Reassuring fetal heart tracing.  GBS negative. Delivery plan: Hopeful for vaginal delivery. Rhogam postpartum.     Rubie Maid, MD [facility]

## 2020-12-16 NOTE — Progress Notes (Signed)
Problem ROB: Patient presents for complaints of several gushes this morning, starting at 0915 this morning. She notes infrequent contractions. Small amount of thin white watery discharge noted on exam. Nitrazine test negative but Ferning test positive. Confirmed ROM.  Will send patient to L&D for IOL. Is GBS neg. Was previously scheduled for IOL on Tuesday.

## 2020-12-17 ENCOUNTER — Encounter: Payer: Self-pay | Admitting: Obstetrics and Gynecology

## 2020-12-17 ENCOUNTER — Inpatient Hospital Stay: Payer: BC Managed Care – PPO | Admitting: Anesthesiology

## 2020-12-17 DIAGNOSIS — O4202 Full-term premature rupture of membranes, onset of labor within 24 hours of rupture: Secondary | ICD-10-CM

## 2020-12-17 DIAGNOSIS — O360130 Maternal care for anti-D [Rh] antibodies, third trimester, not applicable or unspecified: Secondary | ICD-10-CM

## 2020-12-17 DIAGNOSIS — O99213 Obesity complicating pregnancy, third trimester: Secondary | ICD-10-CM

## 2020-12-17 DIAGNOSIS — Z3A39 39 weeks gestation of pregnancy: Secondary | ICD-10-CM

## 2020-12-17 DIAGNOSIS — E669 Obesity, unspecified: Secondary | ICD-10-CM

## 2020-12-17 LAB — RPR: RPR Ser Ql: NONREACTIVE

## 2020-12-17 MED ORDER — EPHEDRINE 5 MG/ML INJ: 10.0000 mg | INTRAVENOUS | Status: DC | PRN

## 2020-12-17 MED ORDER — LIDOCAINE HCL (PF) 1 % IJ SOLN
INTRAMUSCULAR | Status: DC | PRN
  Administered 2020-12-17 (×2): 1 mL via SUBCUTANEOUS

## 2020-12-17 MED ORDER — PHENYLEPHRINE 40 MCG/ML (10ML) SYRINGE FOR IV PUSH (FOR BLOOD PRESSURE SUPPORT): 80.0000 ug | PREFILLED_SYRINGE | INTRAVENOUS | Status: DC | PRN

## 2020-12-17 MED ORDER — METHYLERGONOVINE MALEATE 0.2 MG PO TABS
0.2000 mg | ORAL_TABLET | ORAL | Status: DC | PRN
Start: 2020-12-17 — End: 2020-12-19
  Filled 2020-12-17: qty 1

## 2020-12-17 MED ORDER — DIPHENHYDRAMINE HCL 25 MG PO CAPS
25.0000 mg | ORAL_CAPSULE | Freq: Four times a day (QID) | ORAL | Status: DC | PRN
Start: 2020-12-17 — End: 2020-12-19

## 2020-12-17 MED ORDER — IBUPROFEN 600 MG PO TABS
600.0000 mg | ORAL_TABLET | Freq: Four times a day (QID) | ORAL | Status: DC
  Administered 2020-12-18 – 2020-12-19 (×7): 600 mg via ORAL
  Filled 2020-12-17 (×8): qty 1

## 2020-12-17 MED ORDER — NALBUPHINE HCL 10 MG/ML IJ SOLN
INTRAMUSCULAR | Status: AC
  Filled 2020-12-17: qty 1

## 2020-12-17 MED ORDER — SENNOSIDES-DOCUSATE SODIUM 8.6-50 MG PO TABS
2.0000 | ORAL_TABLET | Freq: Every day | ORAL | Status: DC
  Administered 2020-12-18 – 2020-12-19 (×2): 2 via ORAL
  Filled 2020-12-17 (×2): qty 2

## 2020-12-17 MED ORDER — SODIUM CHLORIDE 0.9 % IV SOLN
INTRAVENOUS | Status: DC | PRN
  Administered 2020-12-17 (×2): 5 mL via EPIDURAL

## 2020-12-17 MED ORDER — DIPHENHYDRAMINE HCL 50 MG/ML IJ SOLN
12.5000 mg | INTRAMUSCULAR | Status: DC | PRN
Start: 2020-12-17 — End: 2020-12-17

## 2020-12-17 MED ORDER — ZOLPIDEM TARTRATE 5 MG PO TABS: 5.0000 mg | ORAL_TABLET | Freq: Every evening | ORAL | Status: DC | PRN

## 2020-12-17 MED ORDER — DIBUCAINE (PERIANAL) 1 % EX OINT
1.0000 "application " | TOPICAL_OINTMENT | CUTANEOUS | Status: DC | PRN
  Filled 2020-12-17 (×3): qty 28

## 2020-12-17 MED ORDER — PROMETHAZINE HCL 25 MG/ML IJ SOLN
INTRAMUSCULAR | Status: AC
  Filled 2020-12-17: qty 1

## 2020-12-17 MED ORDER — COCONUT OIL OIL
1.0000 "application " | TOPICAL_OIL | Status: DC | PRN
  Filled 2020-12-17: qty 120

## 2020-12-17 MED ORDER — METHYLERGONOVINE MALEATE 0.2 MG/ML IJ SOLN: 0.2000 mg | INTRAMUSCULAR | Status: DC | PRN

## 2020-12-17 MED ORDER — NALBUPHINE HCL 10 MG/ML IJ SOLN
10.0000 mg | INTRAMUSCULAR | Status: DC | PRN
  Administered 2020-12-17: 10 mg via INTRAVENOUS

## 2020-12-17 MED ORDER — SODIUM CHLORIDE 0.9 % IV SOLN
2.0000 g | Freq: Four times a day (QID) | INTRAVENOUS | Status: DC
  Administered 2020-12-17 (×2): 2 g via INTRAVENOUS
  Filled 2020-12-17 (×2): qty 2000

## 2020-12-17 MED ORDER — LIDOCAINE-EPINEPHRINE (PF) 1.5 %-1:200000 IJ SOLN
INTRAMUSCULAR | Status: DC | PRN
  Administered 2020-12-17: 3 mL via EPIDURAL

## 2020-12-17 MED ORDER — WITCH HAZEL-GLYCERIN EX PADS
1.0000 "application " | MEDICATED_PAD | CUTANEOUS | Status: DC | PRN
  Filled 2020-12-17 (×3): qty 100

## 2020-12-17 MED ORDER — FENTANYL 2.5 MCG/ML W/ROPIVACAINE 0.15% IN NS 100 ML EPIDURAL (ARMC)
12.0000 mL/h | EPIDURAL | Status: DC
  Administered 2020-12-17 (×2): 12 mL/h via EPIDURAL
  Filled 2020-12-17: qty 100

## 2020-12-17 MED ORDER — SIMETHICONE 80 MG PO CHEW: 80.0000 mg | CHEWABLE_TABLET | ORAL | Status: DC | PRN

## 2020-12-17 MED ORDER — ONDANSETRON HCL 4 MG PO TABS
4.0000 mg | ORAL_TABLET | ORAL | Status: DC | PRN
  Filled 2020-12-17: qty 1

## 2020-12-17 MED ORDER — MISOPROSTOL 25 MCG QUARTER TABLET
25.0000 ug | ORAL_TABLET | Freq: Once | ORAL | Status: AC
  Administered 2020-12-17: 25 ug via BUCCAL
  Filled 2020-12-17: qty 1

## 2020-12-17 MED ORDER — PRENATAL MULTIVITAMIN CH
1.0000 | ORAL_TABLET | Freq: Every day | ORAL | Status: DC
  Administered 2020-12-18 – 2020-12-19 (×2): 1 via ORAL
  Filled 2020-12-17 (×2): qty 1

## 2020-12-17 MED ORDER — BUTORPHANOL TARTRATE 1 MG/ML IJ SOLN: 2.0000 mg | Freq: Once | INTRAMUSCULAR | Status: DC

## 2020-12-17 MED ORDER — FENTANYL 2.5 MCG/ML W/ROPIVACAINE 0.15% IN NS 100 ML EPIDURAL (ARMC)
EPIDURAL | Status: AC
  Filled 2020-12-17: qty 100

## 2020-12-17 MED ORDER — PROMETHAZINE HCL 25 MG/ML IJ SOLN
25.0000 mg | Freq: Four times a day (QID) | INTRAMUSCULAR | Status: DC | PRN
  Administered 2020-12-17: 25 mg via INTRAVENOUS

## 2020-12-17 MED ORDER — ACETAMINOPHEN 325 MG PO TABS
650.0000 mg | ORAL_TABLET | ORAL | Status: DC | PRN
  Administered 2020-12-18 – 2020-12-19 (×3): 650 mg via ORAL
  Filled 2020-12-17 (×3): qty 2

## 2020-12-17 MED ORDER — SERTRALINE HCL 25 MG PO TABS
25.0000 mg | ORAL_TABLET | Freq: Every day | ORAL | Status: DC
  Administered 2020-12-18: 25 mg via ORAL
  Filled 2020-12-17: qty 1

## 2020-12-17 MED ORDER — ONDANSETRON HCL 4 MG/2ML IJ SOLN: 4.0000 mg | INTRAMUSCULAR | Status: DC | PRN

## 2020-12-17 MED ORDER — BENZOCAINE-MENTHOL 20-0.5 % EX AERO
1.0000 "application " | INHALATION_SPRAY | CUTANEOUS | Status: DC | PRN
  Filled 2020-12-17 (×3): qty 56

## 2020-12-17 MED ORDER — LACTATED RINGERS IV SOLN
500.0000 mL | Freq: Once | INTRAVENOUS | Status: AC
  Administered 2020-12-17: 500 mL via INTRAVENOUS

## 2020-12-17 NOTE — Progress Notes (Signed)
Intrapartum Progress Note  S: Patient with Cooks catheter expulsion around 11 a.m.  Recently completed epidural placement. Feeling comfortable.   O: Blood pressure 121/65, pulse 99, temperature 98.4 F (36.9 C), temperature source Oral, resp. rate 17, height 5\' 3"  (1.6 m), weight 106.6 kg, last menstrual period 03/14/2020, SpO2 98 %. Gen App: NAD, comfortable Abdomen: soft, gravid FHT: baseline 140 bpm.  Accels present.  Decels absent.  Moderate in degree variability.   Tocometer: contractions q 2-3 minutes Cervix: 5-6/70/-3/gross pooling of clear fluid Extremities: Nontender, no edema.  Pitocin: 6 mIU  Labs: No new labs   Assessment:  1: SIUP at [redacted]w[redacted]d 2. PROM 3. Rh negative  Plan:  1. Continue IOL for PROM with  Pitocin. Now prolonged, > 24 hrs.  Received first dose of antibiotics (Ampicillin at 9:30 this morning), continue q 6 hrs.   2. Likely rupture of forebag after Cook's catheter expulsion placement as large amounts of fluid now noted on exam. 3. Comfortable with epidural in place.  4. Fetal status remains reassuring.  5. Anticipate vaginal delivery. For Rhogam postpartum, as indicated.     Rubie Maid, MD 12/17/2020 12:18 PM

## 2020-12-17 NOTE — Anesthesia Procedure Notes (Signed)
Epidural Patient location during procedure: OB Start time: 12/17/2020 11:15 AM End time: 12/17/2020 11:26 AM  Staffing Anesthesiologist: Riyaan Heroux, Precious Haws, MD Performed: anesthesiologist   Preanesthetic Checklist Completed: patient identified, IV checked, site marked, risks and benefits discussed, surgical consent, monitors and equipment checked, pre-op evaluation and timeout performed  Epidural Patient position: sitting Prep: ChloraPrep Patient monitoring: heart rate, continuous pulse ox and blood pressure Approach: midline Location: L3-L4 Injection technique: LOR saline  Needle:  Needle type: Tuohy  Needle gauge: 17 G Needle length: 9 cm and 9 Needle insertion depth: 8 cm Catheter type: closed end flexible Catheter size: 19 Gauge Catheter at skin depth: 13 cm Test dose: negative and 1.5% lidocaine with Epi 1:200 K  Assessment Sensory level: T10 Events: blood not aspirated, injection not painful, no injection resistance, no paresthesia and negative IV test  Additional Notes 2 attempts Pt. Evaluated and documentation done after procedure finished. Patient identified. Risks/Benefits/Options discussed with patient including but not limited to bleeding, infection, nerve damage, paralysis, failed block, incomplete pain control, headache, blood pressure changes, nausea, vomiting, reactions to medication both or allergic, itching and postpartum back pain. Confirmed with bedside nurse the patient's most recent platelet count. Confirmed with patient that they are not currently taking any anticoagulation, have any bleeding history or any family history of bleeding disorders. Patient expressed understanding and wished to proceed. All questions were answered. Sterile technique was used throughout the entire procedure. Please see nursing notes for vital signs. Test dose was given through epidural catheter and negative prior to continuing to dose epidural or start infusion. Warning signs  of high block given to the patient including shortness of breath, tingling/numbness in hands, complete motor block, or any concerning symptoms with instructions to call for help. Patient was given instructions on fall risk and not to get out of bed. All questions and concerns addressed with instructions to call with any issues or inadequate analgesia.   Patient tolerated the insertion well without immediate complications.Reason for block:procedure for pain

## 2020-12-17 NOTE — Progress Notes (Signed)
Intrapartum Progress Note  S: Patient without complaints, comfortable.   O: Blood pressure 123/75, pulse 91, temperature 98.2 F (36.8 C), temperature source Oral, resp. rate 17, height 5\' 3"  (1.6 m), weight 106.6 kg, last menstrual period 03/14/2020, SpO2 99 %. Gen App: NAD, comfortable Abdomen: soft, gravid FHT: baseline 140 bpm.  Accels present.  Decels absent.  Moderate in degree variability.   Tocometer: contractions irregular q 2-3 minutes Cervix: 10/100/0 to +1 Extremities: Nontender, no edema.  Pitocin: 18 mIU  Labs: No new labs   Assessment:  1: SIUP at [redacted]w[redacted]d 2. PROM 3. Rh negative  Plan:  1. Continue IOL for PROM with  Pitocin. Attempted to push with patient but unable to feel muscles for pushing due to dense epidural. Will allow to labor down for 1 hour. 2. Prolonged PROM, remains afebrile. Receiving antibiotics Ampicillin q 6 hrs.  3. Anticipate vaginal delivery soon.     Rubie Maid, MD 12/17/2020 6:16 PM

## 2020-12-17 NOTE — Anesthesia Preprocedure Evaluation (Signed)
Anesthesia Evaluation  Patient identified by MRN, date of birth, ID band Patient awake    Reviewed: Allergy & Precautions, H&P , NPO status , Patient's Chart, lab work & pertinent test results  History of Anesthesia Complications Negative for: history of anesthetic complications  Airway Mallampati: III  TM Distance: >3 FB Neck ROM: full    Dental  (+) Chipped   Pulmonary neg pulmonary ROS, former smoker,    Pulmonary exam normal        Cardiovascular Exercise Tolerance: Good (-) hypertensionnegative cardio ROS Normal cardiovascular exam     Neuro/Psych PSYCHIATRIC DISORDERS    GI/Hepatic negative GI ROS, neg GERD  ,  Endo/Other  Morbid obesity  Renal/GU   negative genitourinary   Musculoskeletal   Abdominal   Peds  Hematology negative hematology ROS (+)   Anesthesia Other Findings Baseline nausea and back pain before epidural placement  Past Medical History: No date: Anxiety No date: Cancer [facility])     Comment:  basal cell carcinoma No date: Depression  Past Surgical History: 2017: LIPOMA EXCISION; Left     Comment:  shoulder No date: SKIN CANCER EXCISION  BMI    Body Mass Index: 41.63 kg/m      Reproductive/Obstetrics (+) Pregnancy                             Anesthesia Physical Anesthesia Plan  ASA: III  Anesthesia Plan: Epidural   Post-op Pain Management:    Induction:   PONV Risk Score and Plan:   Airway Management Planned: Natural Airway  Additional Equipment:   Intra-op Plan:   Post-operative Plan:   Informed Consent: I have reviewed the patients History and Physical, chart, labs and discussed the procedure including the risks, benefits and alternatives for the proposed anesthesia with the patient or authorized representative who has indicated his/her understanding and acceptance.     Dental Advisory Given  Plan Discussed with:  Anesthesiologist  Anesthesia Plan Comments: (Patient reports no bleeding problems and no anticoagulant use.   Patient consented for risks of anesthesia including but not limited to:  - adverse reactions to medications - risk of bleeding, infection and or nerve damage from epidural that could lead to paralysis - risk of headache or failed epidural - nerve damage due to positioning - that if epidural is used for C-section that there is a chance of epidural failure requiring spinal placement or conversion to GA - Damage to heart, brain, lungs, other parts of body or loss of life  Patient voiced understanding.)        Anesthesia Quick Evaluation

## 2020-12-17 NOTE — Progress Notes (Signed)
Intrapartum Progress Note  S: Patient reporting pain with contractions, not controlled as much with Stadol. Also noting back pain.   O: Blood pressure 121/78, pulse 79, temperature 97.9 F (36.6 C), temperature source Oral, resp. rate 17, height 5\' 3"  (1.6 m), weight 106.6 kg, last menstrual period 03/14/2020. Gen App: NAD, comfortable Abdomen: soft, gravid FHT: baseline 140 bpm.  Accels present.  Decels absent.  Moderate in degree variability with short periods of minimal variability).   Tocometer: contractions q irregular, q 1-2 minutes Cervix: 1.5/70/-3 Extremities: Nontender, no edema.   Labs: No new labs   Assessment:  1: SIUP at [redacted]w[redacted]d 2. PROM 3. Rh negative  Plan:  1. Continue IOL for PROM.  Is s/p 3rd dose of Cytotec at 0300 this morning (dose initially held for concerns of tachysystole. Cook's catheter inserted.  Will initiate antibiotics for prolonged PROM after 24 hrs since catheter in place. To begin Pitocin at low dose until bulb expelled.  2. Previously getting Stadol but feels it is no longer effective. Will change to Nubaine.  3. Has been complaining of nausea, received a dose of Zofran. Can alternate with Phenergan every 6 hrs.   4. Advise on moving and changing positions for suspected back labor.  5. For Rhogam postpartum, as indicated.     Rubie Maid, MD 12/17/2020 7:30 AM

## 2020-12-18 LAB — CBC
HCT: 28.1 % — ABNORMAL LOW (ref 36.0–46.0)
Hemoglobin: 9.7 g/dL — ABNORMAL LOW (ref 12.0–15.0)
MCH: 32.9 pg (ref 26.0–34.0)
MCHC: 34.5 g/dL (ref 30.0–36.0)
MCV: 95.3 fL (ref 80.0–100.0)
Platelets: 173 10*3/uL (ref 150–400)
RBC: 2.95 MIL/uL — ABNORMAL LOW (ref 3.87–5.11)
RDW: 13.4 % (ref 11.5–15.5)
WBC: 18.4 10*3/uL — ABNORMAL HIGH (ref 4.0–10.5)
nRBC: 0 % (ref 0.0–0.2)

## 2020-12-18 MED ORDER — SERTRALINE HCL 25 MG PO TABS
25.0000 mg | ORAL_TABLET | Freq: Every day | ORAL | Status: DC
  Administered 2020-12-18: 25 mg via ORAL
  Filled 2020-12-18: qty 1

## 2020-12-18 MED ORDER — OXYCODONE-ACETAMINOPHEN 5-325 MG PO TABS
ORAL_TABLET | ORAL | Status: AC
  Administered 2020-12-18: 1 via ORAL
  Filled 2020-12-18: qty 1

## 2020-12-18 MED ORDER — OXYCODONE-ACETAMINOPHEN 5-325 MG PO TABS
1.0000 | ORAL_TABLET | ORAL | Status: DC | PRN
  Administered 2020-12-18 (×2): 1 via ORAL
  Filled 2020-12-18 (×2): qty 1

## 2020-12-18 NOTE — Lactation Note (Signed)
This note was copied from a baby's chart. Lactation Consultation Note  Patient Name: Mandy Hughes [id number] Date: 12/18/2020 Reason for consult: Follow-up assessment;Mother's request;Difficult latch;Primapara;Term;Nipple pain/trauma  LC has been assisting mom with breast feeding all day.  Mandy Hughes has been having issues with low temperatures which has seemed to resolve. He had a difficult vaginal delivery with significant head trauma.  It is sometimes hard to get him in a comfortable position for feeding.  Mom is becoming more independent and FOB is helpful and supportive.  Mandy Hughes latches to left breast with minimal assistance and has strong, rhythmic suck with audible swallows and mom experiences very little discomfort.  Mom has a fibrocystic lump that was diagnosed when she was 30 years old on her right areola above her nipple.  This lump interferes with Mandy Hughes achieving a deep latch sometimes.  Since he has been getting on the tip of the right nipple and pinching, mom's right nipple is sore and had some slight bleeding at this breast feed.  Switched him to the football hold which seemed to help with discomfort and pinching.  Also demonstrated tea cup hold on this side which helped with achieving deeper latch.  Demonstrated how to hand express after breast feed and rub on nipples to prevent bacteria, lubricate and help with healing and discomfort.  Coconut oil and comfort gels given with instructions in alternating use.  Hand out given on what to expect with breast feeds the first 4 days of life explaining normal newborn stomach size, feeding cues, adequate intake and out put, supply and demand, burping, normal course of lactation and routine newborn feeding patterns.  Lactation [employer] and LLL sheet given reviewing contact numbers, informative web sites and available support groups.  Lactation name and number written on white board and encouraged to call with any questions, concerns or  assistance.    Maternal Data Formula Feeding for Exclusion: No Has patient been taught Hand Expression?: Yes Does the patient have breastfeeding experience prior to this delivery?: No (Gr1)  Feeding Feeding Type: Breast Fed  LATCH Score Latch: Repeated attempts needed to sustain latch, nipple held in mouth throughout feeding, stimulation needed to elicit sucking reflex.  Audible Swallowing: A few with stimulation  Type of Nipple: Everted at rest and after stimulation  Comfort (Breast/Nipple): Filling, red/small blisters or bruises, mild/mod discomfort  Hold (Positioning): Assistance needed to correctly position infant at breast and maintain latch.  LATCH Score: 6  Interventions Interventions: Breast feeding basics reviewed;Assisted with latch;Skin to skin;Breast massage;Hand express;Reverse pressure;Breast compression;Adjust position;Support pillows;Position options;Coconut oil;Comfort gels  Lactation Tools Discussed/Used Tools: Coconut oil;Comfort gels Nipple shield size:  (No using nipple shield - latching without) WIC Program: No [employer])   Consult Status Consult Status: Follow-up Follow-up type: Call as needed    Jarold Motto 12/18/2020, 6:37 PM

## 2020-12-18 NOTE — Anesthesia Postprocedure Evaluation (Signed)
Anesthesia Post Note  Patient: Kaliope Quinonez  Procedure(s) Performed: AN AD [place]  Patient location during evaluation: Mother Baby Anesthesia Type: Epidural Level of consciousness: oriented and awake and alert Pain management: pain level controlled Vital Signs Assessment: post-procedure vital signs reviewed and stable Respiratory status: spontaneous breathing and respiratory function stable Cardiovascular status: blood pressure returned to baseline and stable Postop Assessment: no headache, no backache, no apparent nausea or vomiting and able to ambulate Anesthetic complications: no   No complications documented.   Last Vitals:  Vitals:   12/18/20 0200 12/18/20 0300  BP: 122/78 (!) 105/54  Pulse: 78 80  Resp: 20 18  Temp: 36.6 C 36.6 C  SpO2: 98% 99%    Last Pain:  Vitals:   12/18/20 0629  TempSrc:   PainSc: Asleep                 Alison Stalling

## 2020-12-18 NOTE — Progress Notes (Signed)
Post Partum Day # 1, s/p SVD  Subjective: up ad lib, voiding and tolerating PO.  Patient notes that right leg is still a little numb and tingly but otherwise good. Able to bear weight. Notes this was also the leg that was more dense during her epidural. Notes vuvlvar swelling has decreased. Reports normal lochia.   Objective: Temp:  [97.7 F (36.5 C)-98.9 F (37.2 C)] 97.7 F (36.5 C) (12/19 0816) Pulse Rate:  [65-108] 66 (12/19 0816) Resp:  [18-20] 18 (12/19 0816) BP: (99-148)/(47-78) 113/70 (12/19 0816) SpO2:  [96 %-99 %] 98 % (12/19 0816)  Physical Exam:  General: alert and no distress  Lungs: clear to auscultation bilaterally Breasts: normal appearance, no masses or tenderness Heart: regular rate and rhythm, S1, S2 normal, no murmur, click, rub or gallop Abdomen: soft, non-tender; bowel sounds normal; no masses,  no organomegaly Pelvis: Lochia: appropriate, right vulva mildly swollen. Uterine Fundus: firm Extremities: DVT Evaluation: No evidence of DVT seen on physical exam. Negative Homan's sign. No cords or calf tenderness. No significant calf/ankle edema.  Recent Labs    12/16/20 1448 12/18/20 0525  HGB 11.1* 9.7*  HCT 33.0* 28.1*    Assessment/Plan: Continue routine postpartum care  Breastfeeding, Lactation consult  Circumcision prior to discharge Continue ice packs to perineum for vulvar swelling Rhogam postpartum as indicated.   Mild anemia postpartum, mild anemia at end of pregnancy. Treat with PO iron postpartum.  Plan for discharge tomorrow   LOS: 2 days   Rubie Maid, MD [facility] 12/18/2020 10:35 AM

## 2020-12-19 LAB — FETAL SCREEN: Fetal Screen: NEGATIVE

## 2020-12-19 MED ORDER — IBUPROFEN 600 MG PO TABS: 600.0000 mg | ORAL_TABLET | Freq: Four times a day (QID) | ORAL | 1 refills | Status: DC | PRN

## 2020-12-19 MED ORDER — RHO D IMMUNE GLOBULIN 1500 UNIT/2ML IJ SOSY
300.0000 ug | PREFILLED_SYRINGE | Freq: Once | INTRAMUSCULAR | Status: AC
  Administered 2020-12-19: 300 ug via INTRAVENOUS
  Filled 2020-12-19: qty 2

## 2020-12-19 NOTE — Discharge Summary (Signed)
Postpartum Discharge Summary       Patient Name: Mandy Hughes DOB: Jan 20, 1990 MRN: [id number]  Date of admission: 12/16/2020 Delivery date:12/17/2020  Delivering provider: Rubie Maid  Date of discharge: 12/19/2020  Admitting diagnosis: Term pregnancy [Z34.90] Intrauterine pregnancy: [redacted]w[redacted]d    Secondary diagnosis:  Principal Problem:   PROM (premature rupture of membranes) Active Problems:   Rh negative state in antepartum period   Anxiety during pregnancy   Term pregnancy   Obesity affecting pregnancy in third trimester  Additional problems: Anemia of pregnancy (mild)    Discharge diagnosis: Term Pregnancy Delivered and Anemia                                              Post partum procedures:rhogam Augmentation: Pitocin, Cytotec and IP Foley Complications: RKWI>09hours  Hospital course: Induction of Labor With Vaginal Delivery   30y.o. yo G1P1001 at 321w0das admitted to the hospital 12/16/2020 for induction of labor.  Indication for induction: PROM.  Patient had an uncomplicated labor course as follows: Membrane Rupture Time/Date: 9:15 AM ,12/16/2020   Delivery Method:Vaginal, Vacuum (Extractor)  Episiotomy: None  Lacerations:  Vaginal;1st degree Perineal  Details of delivery can be found in separate delivery note.  Patient had a routine postpartum course. Patient is discharged home 12/19/20.  Newborn Data: Birth date:12/17/2020  Birth time:9:51 PM  Gender:Female  Living status:Living  Apgars:4 ,8  Weight:4040 g   Magnesium Sulfate received: No BMZ received: No Rhophylac:Yes MMR:No T-DaP:Given prenatally Flu: Given prenatally  Transfusion:No  Physical exam  Vitals:   12/18/20 1123 12/18/20 1600 12/19/20 0005 12/19/20 0826  BP: 109/76 115/69 117/72 (!) 108/59  Pulse: 84 77 89 81  Resp: 18 18 18 18   Temp: 97.6 F (36.4 C) 97.8 F (36.6 C) 97.6 F (36.4 C) 97.7 F (36.5 C)  TempSrc: Oral Oral Oral Oral  SpO2:   98% 97%  Weight:       Height:       General: alert, cooperative and no distress Lochia: appropriate Uterine Fundus: firm Incision: N/A DVT Evaluation: No evidence of DVT seen on physical exam. Negative Homan's sign. No cords or calf tenderness. No significant calf/ankle edema. Labs: Lab Results  Component Value Date   WBC 18.4 (H) 12/18/2020   HGB 9.7 (L) 12/18/2020   HCT 28.1 (L) 12/18/2020   MCV 95.3 12/18/2020   PLT 173 12/18/2020   CMP Latest Ref Rng & Units 01/16/2017  Glucose 65 - 99 mg/dL -  BUN 6 - 20 mg/dL -  Creatinine 0.44 - 1.00 mg/dL -  Sodium 135 - 145 mmol/L -  Potassium 3.5 - 5.1 mmol/L -  Chloride 101 - 111 mmol/L -  CO2 22 - 32 mmol/L -  Calcium 8.9 - 10.3 mg/dL -  Total Protein 6.5 - 8.1 g/dL 7.2  Total Bilirubin 0.3 - 1.2 mg/dL 0.3  Alkaline Phos 38 - 126 U/L 60  AST 15 - 41 U/L 21  ALT 14 - 54 U/L 20   Edinburgh Score: Edinburgh Postnatal Depression Scale Screening Tool 12/19/2020  I have been able to laugh and see the funny side of things. 0  I have looked forward with enjoyment to things. 0  I have blamed myself unnecessarily when things went wrong. 1  I have been anxious or worried for no good reason. 2  I  have felt scared or panicky for no good reason. 1  Things have been getting on top of me. 0  I have been so unhappy that I have had difficulty sleeping. 0  I have felt sad or miserable. 0  I have been so unhappy that I have been crying. 0  The thought of harming myself has occurred to me. 0  Edinburgh Postnatal Depression Scale Total 4     After visit meds:  Allergies as of 12/19/2020      Reactions   Latex Other (See Comments)   Tape Rash      Medication List    STOP taking these medications   Doxylamine-Pyridoxine 10-10 MG Tbec Commonly known as: Diclegis   hydrOXYzine 25 MG tablet Commonly known as: ATARAX/VISTARIL   ondansetron 4 MG disintegrating tablet Commonly known as: ZOFRAN-ODT   ondansetron 4 MG tablet Commonly known as: ZOFRAN      TAKE these medications   acetaminophen 500 MG tablet Commonly known as: TYLENOL Take 500 mg by mouth every 6 (six) hours as needed.   busPIRone 15 MG tablet Commonly known as: BUSPAR Take 15 mg by mouth 3 (three) times daily.   docusate sodium 100 MG capsule Commonly known as: COLACE Take 100 mg by mouth 2 (two) times daily.   ibuprofen 600 MG tablet Commonly known as: ADVIL Take 1 tablet (600 mg total) by mouth every 6 (six) hours as needed.   PRENATAL ADULT GUMMY/DHA/FA PO Take by mouth.   sertraline 25 MG tablet Commonly known as: Zoloft Take 1 tablet (25 mg total) by mouth daily.        Discharge home in stable condition Infant Feeding: Breast Infant Disposition:home with mother Discharge instruction: per After Visit Summary and Postpartum booklet. Activity: Advance as tolerated. Pelvic rest for 6 weeks.  Diet: routine diet Anticipated Birth Control: OCPs Postpartum Appointment:6 weeks Additional Postpartum F/U: Postpartum Mood checkup in 2 weeks Future Appointments:No future appointments. Follow up Visit:  Follow-up Information    Rubie Maid, MD Follow up.   Specialties: Obstetrics and Gynecology, Radiology Why: 2 week televisit mood check 6 weeks postpartum visit Contact information: [address] [place] [address] [phone]                  12/19/2020 Rubie Maid, MD [facility]

## 2020-12-19 NOTE — Progress Notes (Signed)
Patient discharged home with infant. Discharge instructions and prescriptions given and reviewed with patient. Patient verbalized understanding. Escorted out by auxillary.

## 2020-12-19 NOTE — Progress Notes (Signed)
Edinburgh score: 4 GAD-7 Anxiety score: 1

## 2020-12-20 ENCOUNTER — Emergency Department: Payer: BC Managed Care – PPO

## 2020-12-20 ENCOUNTER — Emergency Department
Admission: EM | Admit: 2020-12-20 | Discharge: 2020-12-21 | Disposition: A | Discharge status: Home / Self Care | Payer: BC Managed Care – PPO | Attending: Emergency Medicine | Admitting: Emergency Medicine

## 2020-12-20 ENCOUNTER — Other Ambulatory Visit: Payer: Self-pay

## 2020-12-20 DIAGNOSIS — R109 Unspecified abdominal pain: Secondary | ICD-10-CM | POA: Insufficient documentation

## 2020-12-20 DIAGNOSIS — Z85828 Personal history of other malignant neoplasm of skin: Secondary | ICD-10-CM | POA: Diagnosis not present

## 2020-12-20 DIAGNOSIS — Z87891 Personal history of nicotine dependence: Secondary | ICD-10-CM | POA: Diagnosis not present

## 2020-12-20 DIAGNOSIS — Z91040 Latex allergy status: Secondary | ICD-10-CM | POA: Diagnosis not present

## 2020-12-20 DIAGNOSIS — R1033 Periumbilical pain: Secondary | ICD-10-CM

## 2020-12-20 LAB — URINALYSIS, COMPLETE (UACMP) WITH MICROSCOPIC
Bilirubin Urine: NEGATIVE
Glucose, UA: NEGATIVE mg/dL
Ketones, ur: NEGATIVE mg/dL
Leukocytes,Ua: NEGATIVE
Nitrite: NEGATIVE
Protein, ur: NEGATIVE mg/dL
Specific Gravity, Urine: 1.014 (ref 1.005–1.030)
Squamous Epithelial / HPF: NONE SEEN (ref 0–5)
pH: 6 (ref 5.0–8.0)

## 2020-12-20 LAB — CBC
HCT: 28 % — ABNORMAL LOW (ref 36.0–46.0)
Hemoglobin: 9.1 g/dL — ABNORMAL LOW (ref 12.0–15.0)
MCH: 31.6 pg (ref 26.0–34.0)
MCHC: 32.5 g/dL (ref 30.0–36.0)
MCV: 97.2 fL (ref 80.0–100.0)
Platelets: 213 10*3/uL (ref 150–400)
RBC: 2.88 MIL/uL — ABNORMAL LOW (ref 3.87–5.11)
RDW: 13.2 % (ref 11.5–15.5)
WBC: 7.7 10*3/uL (ref 4.0–10.5)
nRBC: 0 % (ref 0.0–0.2)

## 2020-12-20 LAB — COMPREHENSIVE METABOLIC PANEL
ALT: 28 U/L (ref 0–44)
AST: 30 U/L (ref 15–41)
Albumin: 2.8 g/dL — ABNORMAL LOW (ref 3.5–5.0)
Alkaline Phosphatase: 128 U/L — ABNORMAL HIGH (ref 38–126)
Anion gap: 10 (ref 5–15)
BUN: 9 mg/dL (ref 6–20)
CO2: 22 mmol/L (ref 22–32)
Calcium: 8.5 mg/dL — ABNORMAL LOW (ref 8.9–10.3)
Chloride: 110 mmol/L (ref 98–111)
Creatinine, Ser: 0.75 mg/dL (ref 0.44–1.00)
GFR, Estimated: >60 mL/min (ref >60)
Glucose, Bld: 87 mg/dL (ref 70–99)
Potassium: 3.5 mmol/L (ref 3.5–5.1)
Sodium: 142 mmol/L (ref 135–145)
Total Bilirubin: 0.4 mg/dL (ref 0.3–1.2)
Total Protein: 6 g/dL — ABNORMAL LOW (ref 6.5–8.1)

## 2020-12-20 LAB — RHOGAM INJECTION: Unit division: 0

## 2020-12-20 LAB — LIPASE, BLOOD: Lipase: 30 U/L (ref 11–51)

## 2020-12-20 MED ORDER — IBUPROFEN 400 MG PO TABS
400.0000 mg | ORAL_TABLET | Freq: Once | ORAL | Status: AC | PRN
  Administered 2020-12-20: 400 mg via ORAL
  Filled 2020-12-20: qty 1

## 2020-12-20 MED ORDER — IOHEXOL 300 MG/ML  SOLN
100.0000 mL | Freq: Once | INTRAMUSCULAR | Status: AC | PRN
  Administered 2020-12-20: 100 mL via INTRAVENOUS

## 2020-12-20 NOTE — ED Notes (Signed)
MD at bedside. 

## 2020-12-20 NOTE — ED Triage Notes (Signed)
Pt here with complaints of mid-abdominal pain after delivery 3 days ago, also having stabbing pain to left side and tenderness to entire upper abdomen. Pt report vaginal birth, pushed for 4 hours and labored for 39 hours, vacuumed assited. Denies any foul smelling discharge.

## 2020-12-21 ENCOUNTER — Encounter: Payer: Self-pay | Admitting: Obstetrics and Gynecology

## 2020-12-21 ENCOUNTER — Ambulatory Visit (INDEPENDENT_AMBULATORY_CARE_PROVIDER_SITE_OTHER): Payer: BC Managed Care – PPO | Admitting: Obstetrics and Gynecology

## 2020-12-21 VITALS — BP 118/75 | HR 91 | Ht 64.0 in | Wt 231.3 lb

## 2020-12-21 DIAGNOSIS — R1033 Periumbilical pain: Secondary | ICD-10-CM

## 2020-12-21 MED ORDER — AMOXICILLIN-POT CLAVULANATE 875-125 MG PO TABS: 1.0000 | ORAL_TABLET | Freq: Two times a day (BID) | ORAL | 0 refills | Status: AC

## 2020-12-21 NOTE — ED Provider Notes (Addendum)
[facility] Emergency Department Provider Note  Time seen: 12:10 AM  I have reviewed the triage vital signs and the nursing notes.   HISTORY  Chief Complaint Abdominal Pain   HPI Mandy Hughes is a 30 y.o. female with a past medical history of anxiety, 3 days postpartum NSVD at 39 weeks, complicated by prolonged labor and rupture of membranes greater than 24 hours, presents to the emergency department for abdominal pain.  According to the patient she delivered Saturday evening, had been feeling well/progressively improving until today around noon when she developed periumbilical to left lower quadrant abdominal pain.  Patient states the pain is increased around a 7/10.  Patient had a bowel movement after delivery in the hospital.  No vomiting.  Patient is taking Tylenol/ibuprofen for her abdominal discomfort.  No known fever.  Patient did have a vaginal tear that was repaired with sutures.  Denies any known significant vaginal discharge does have occasional urinary incontinence, no dysuria.   Past Medical History:  Diagnosis Date  . Anxiety   . Cancer ([place])    basal cell carcinoma  . Depression     Patient Active Problem List   Diagnosis Date Noted  . Term pregnancy 12/16/2020  . PROM (premature rupture of membranes) 12/16/2020  . Obesity affecting pregnancy in third trimester 12/02/2020  . Pregnancy 09/21/2020  . Nausea 09/20/2020  . Anxiety during pregnancy 09/09/2020  . Echogenic focus of heart of fetus affecting antepartum care of mother 09/09/2020  . Rh negative state in antepartum period 07/12/2020    Past Surgical History:  Procedure Laterality Date  . LIPOMA EXCISION Left 2017   shoulder  . SKIN CANCER EXCISION      Prior to Admission medications   Medication Sig Start Date End Date Taking? Authorizing Provider  acetaminophen (TYLENOL) 500 MG tablet Take 500 mg by mouth every 6 (six) hours as needed.    [provider]   busPIRone (BUSPAR) 15 MG tablet Take 15 mg by mouth 3 (three) times daily. Patient not taking: Reported on 12/16/2020    [provider]  docusate sodium (COLACE) 100 MG capsule Take 100 mg by mouth 2 (two) times daily.    [provider]  ibuprofen (ADVIL) 600 MG tablet Take 1 tablet (600 mg total) by mouth every 6 (six) hours as needed. 12/19/20   Rubie Maid, MD  Prenatal MV & Min w/FA-DHA (PRENATAL ADULT GUMMY/DHA/FA PO) Take by mouth.    [provider]  sertraline (ZOLOFT) 25 MG tablet Take 1 tablet (25 mg total) by mouth daily. 11/02/20   Rubie Maid, MD    Allergies  Allergen Reactions  . Latex Other (See Comments)  . Tape Rash    Family History  Problem Relation Age of Onset  . Seizures Father        Stepdad  . Cancer Maternal Grandfather   . Migraines Sister   . Bipolar disorder Sister   . Personality disorder Sister     Social History Social History   Tobacco Use  . Smoking status: Former Smoker    Quit date: 07/13/2017    Years since quitting: 3.4  . Smokeless tobacco: Never Used  Vaping Use  . Vaping Use: Never used  Substance Use Topics  . Alcohol use: Not Currently    Comment: rarely  . Drug use: No    Review of Systems Constitutional: Negative for fever. Cardiovascular: Negative for chest pain. Respiratory: Negative for shortness of breath. Gastrointestinal: Moderate  dull mid to left lower quadrant abdominal pain. Genitourinary: Negative for urinary compaints Musculoskeletal: Negative for musculoskeletal complaints Neurological: Negative for headache All other ROS negative  ____________________________________________   PHYSICAL EXAM:  VITAL SIGNS: ED Triage Vitals  Enc Vitals Group     BP 12/20/20 1939 137/77     Pulse Rate 12/20/20 1939 (!) 103     Resp 12/20/20 1939 18     Temp 12/20/20 1939 98.7 F (37.1 C)     Temp Source 12/20/20 1939 Oral     SpO2 12/20/20 1939 100 %     Weight 12/20/20 1940 215 lb  (97.5 kg)     Height 12/20/20 1940 5\' 3"  (1.6 m)     Head Circumference --      Peak Flow --      Pain Score 12/20/20 1947 7     Pain Loc --      Pain Edu? --      Excl. in [place]? --    Constitutional: Alert and oriented. Well appearing and in no distress. Eyes: Normal exam ENT      Head: Normocephalic and atraumatic.      Mouth/Throat: Mucous membranes are moist. Cardiovascular: Normal rate, regular rhythm Respiratory: Normal respiratory effort without tachypnea nor retractions. Breath sounds are clear  Gastrointestinal: Soft, mild to moderate tenderness in the periumbilical to left lower quadrant.  No rebound guarding or distention.  Mild suprapubic tenderness. Musculoskeletal: Nontender with normal range of motion in all extremities Neurologic:  Normal speech and language. No gross focal neurologic deficits Skin:  Skin is warm, dry and intact.  Psychiatric: Mood and affect are normal.   ____________________________________________     RADIOLOGY  CT scan essentially shows a normal postpartum uterus small amount of intrauterine blood and debris.  No gas.  Mild parametrial inflammation.  ____________________________________________   INITIAL IMPRESSION / ASSESSMENT AND PLAN / ED COURSE  Pertinent labs & imaging results that were available during my care of the patient were reviewed by me and considered in my medical decision making (see chart for details).   Patient presents to the emergency department for abdominal pain 3 days postpartum NSVD.  Patient did have rupture of membranes greater than 24 hours and was started on antibiotics during the delivery.  Reassuringly patient has a normal white blood cell count today.  Is afebrile.  CT scan overall reassuring but does show mild amount of parametrial inflammation/stranding.  Given this finding I spoke to Dr. Amalia Hailey who has reviewed the findings but believes with no fever and no white blood cell count patient will be safe for  discharge home.  Given the mild amount of stranding on CT I did elect to place the patient on Augmentin twice daily.  Dr. Amalia Hailey states he will follow up with the patient today 12/22 in the office.  I discussed this with the patient is agreeable to plan of care.  Discussed return precautions for any worsening pain or development of fever.  Mandy Hughes was evaluated in Emergency Department on 12/21/2020 for the symptoms described in the history of present illness. She was evaluated in the context of the global COVID-19 pandemic, which necessitated consideration that the patient might be at risk for infection with the SARS-CoV-2 virus that causes COVID-19. Institutional protocols and algorithms that pertain to the evaluation of patients at risk for COVID-19 are in a state of rapid change based on information released by regulatory bodies including the CDC and federal and state organizations. These  policies and algorithms were followed during the patient's care in the ED.  ____________________________________________   FINAL CLINICAL IMPRESSION(S) / ED DIAGNOSES  Abdominal pain   Harvest Dark, MD 12/21/20 [id number]    Harvest Dark, MD 12/21/20 [phone]

## 2020-12-21 NOTE — ED Notes (Signed)
Verbal consent for discharge.

## 2020-12-21 NOTE — Discharge Instructions (Addendum)
Please call the number provided for OB/GYN to arrange a follow-up appointment later today.  Please take Tylenol ibuprofen as needed for discomfort.  You may begin taking your antibiotic.  Please discuss this with your OB/GYN tomorrow.  If you develop a fever or worsening pain return to the emergency department immediately.

## 2020-12-21 NOTE — Progress Notes (Signed)
HPI:      [name] is a 30 y.o. G1P1001 who LMP was Patient's last menstrual period was 03/14/2020.  Subjective:   She presents today after being seen in the ED last night for periumbilical pain.  Her CT was essentially negative.  No white count no fever no UTI no bowel issues. Patient feeling better today that she still has periumbilical pain but has not as bad.    Hx: The following portions of the patient's history were reviewed and updated as appropriate:             She  has a past medical history of Anxiety, Cancer ([place]), and Depression. She does not have any pertinent problems on file. She  has a past surgical history that includes Lipoma excision (Left, 2017) and Skin cancer excision. Her family history includes Bipolar disorder in her sister; Cancer in her maternal grandfather; Migraines in her sister; Personality disorder in her sister; Seizures in her father. She  reports that she quit smoking about 3 years ago. She has never used smokeless tobacco. She reports previous alcohol use. She reports that she does not use drugs. She has a current medication list which includes the following prescription(s): acetaminophen, docusate sodium, ibuprofen, prenatal mv & min w/fa-dha, sertraline, amoxicillin-clavulanate, and buspirone. She is allergic to latex and tape.       Review of Systems:  Review of Systems  Constitutional: Denied constitutional symptoms, night sweats, recent illness, fatigue, fever, insomnia and weight loss.  Eyes: Denied eye symptoms, eye pain, photophobia, vision change and visual disturbance.  Ears/Nose/Throat/Neck: Denied ear, nose, throat or neck symptoms, hearing loss, nasal discharge, sinus congestion and sore throat.  Cardiovascular: Denied cardiovascular symptoms, arrhythmia, chest pain/pressure, edema, exercise intolerance, orthopnea and palpitations.  Respiratory: Denied pulmonary symptoms, asthma, pleuritic pain, productive sputum, cough, dyspnea  and wheezing.  Gastrointestinal: Denied, gastro-esophageal reflux, melena, nausea and vomiting.  Genitourinary: Denied genitourinary symptoms including symptomatic vaginal discharge, pelvic relaxation issues, and urinary complaints.  Musculoskeletal: Denied musculoskeletal symptoms, stiffness, swelling, muscle weakness and myalgia.  Dermatologic: Denied dermatology symptoms, rash and scar.  Neurologic: Denied neurology symptoms, dizziness, headache, neck pain and syncope.  Psychiatric: Denied psychiatric symptoms, anxiety and depression.  Endocrine: Denied endocrine symptoms including hot flashes and night sweats.   Meds:   Current Outpatient Medications on File Prior to Visit  Medication Sig Dispense Refill  . acetaminophen (TYLENOL) 500 MG tablet Take 500 mg by mouth every 6 (six) hours as needed.    . docusate sodium (COLACE) 100 MG capsule Take 100 mg by mouth 2 (two) times daily.    Marland Kitchen ibuprofen (ADVIL) 600 MG tablet Take 1 tablet (600 mg total) by mouth every 6 (six) hours as needed. 60 tablet 1  . Prenatal MV & Min w/FA-DHA (PRENATAL ADULT GUMMY/DHA/FA PO) Take by mouth.    . sertraline (ZOLOFT) 25 MG tablet Take 1 tablet (25 mg total) by mouth daily. 90 tablet 1  . amoxicillin-clavulanate (AUGMENTIN) 875-125 MG tablet Take 1 tablet by mouth 2 (two) times daily for 10 days. (Patient not taking: Reported on 12/21/2020) 20 tablet 0  . busPIRone (BUSPAR) 15 MG tablet Take 15 mg by mouth 3 (three) times daily. (Patient not taking: No sig reported)     No current facility-administered medications on file prior to visit.      .     Objective:     Vitals:   12/21/20 0955  BP: 118/75  Pulse: 91   Filed Weights  12/21/20 0955  Weight: 231 lb 4.8 oz (104.9 kg)              Abdominal examination reveals abdominal wall pain periumbilical.  No suprapubic pain.  Assessment:    G1P1001 Patient Active Problem List   Diagnosis Date Noted  . Term pregnancy 12/16/2020  . PROM  (premature rupture of membranes) 12/16/2020  . Obesity affecting pregnancy in third trimester 12/02/2020  . Pregnancy 09/21/2020  . Nausea 09/20/2020  . Anxiety during pregnancy 09/09/2020  . Echogenic focus of heart of fetus affecting antepartum care of mother 09/09/2020  . Rh negative state in antepartum period 07/12/2020     1. Periumbilical abdominal pain     Strongly doubt endometritis or infection.  Patient has improved overnight.   Plan:            1.  Likely abdominal strain/possible small hernia from extended labor and pushing.  Strongly doubt infectious process.  2.  Expect pain to resolve over the next few days.  Continue use of ibuprofen.  Patient to contact us if pain worsens or she develops additional symptoms. Orders No orders of the defined types were placed in this encounter.   No orders of the defined types were placed in this encounter.     F/U  Return for Pt to contact us if symptoms worsen.  Finis Bud, M.D. 12/21/2020 10:20 AM

## 2020-12-24 ENCOUNTER — Inpatient Hospital Stay: Admit: 2020-12-24 | Payer: Self-pay

## 2021-01-03 ENCOUNTER — Other Ambulatory Visit: Payer: Self-pay

## 2021-01-03 ENCOUNTER — Ambulatory Visit (INDEPENDENT_AMBULATORY_CARE_PROVIDER_SITE_OTHER): Payer: 59 | Admitting: Obstetrics and Gynecology

## 2021-01-03 ENCOUNTER — Encounter: Payer: Self-pay | Admitting: Obstetrics and Gynecology

## 2021-01-03 DIAGNOSIS — Z1332 Encounter for screening for maternal depression: Secondary | ICD-10-CM

## 2021-01-03 DIAGNOSIS — M6289 Other specified disorders of muscle: Secondary | ICD-10-CM | POA: Diagnosis not present

## 2021-01-03 DIAGNOSIS — Z8659 Personal history of other mental and behavioral disorders: Secondary | ICD-10-CM | POA: Diagnosis not present

## 2021-01-03 DIAGNOSIS — K649 Unspecified hemorrhoids: Secondary | ICD-10-CM

## 2021-01-03 NOTE — Progress Notes (Signed)
Televisit-Pt having televisit for 2 week PPV for mood check. Pt stated that she was doing well no problems.

## 2021-01-03 NOTE — Progress Notes (Signed)
Virtual Visit via Telephone Note  I connected with Mandy Hughes on 01/03/21 at  4:15 PM EST by telephone and verified that I am speaking with the correct person using two identifiers.  Location: Patient: Home Provider: Office   I discussed the limitations, risks, security and privacy concerns of performing an evaluation and management service by telephone and the availability of in person appointments. I also discussed with the patient that there may be a patient responsible charge related to this service. The patient expressed understanding and agreed to proceed.   History of Present Illness: Mandy Hughes is a 31 y.o. G59P1001 female who is 2 weeks s/p vacuum-assisted vaginal delivery.  She reports overall doing well, denies any issues with postpartum anxiety or depression (has a history of anxiety).  Notes having a good support system at home.  Is mostly breastfeeding but occasionally supplements with formula.   Of note, Mandy Hughes does still note that she is having difficulty managing her bladder.  Still noting difficulty with control with standing or changing positions. Also does not often have the urge to go, but is doing timed voiding (every 3-4 hours).  Additionally, she continues to complain of hemorrhoids.  Has been using Tuck's pads, witch hazel, Dermaplast, as well as trying a suppository but this did not help much.     Observations/Objective:  Height 5\' 4"  (1.626 m), last menstrual period 03/14/2020, currently breastfeeding.   Edinburgh Postnatal Depression Scale - 01/03/21 1629      Edinburgh Postnatal Depression Scale:  In the Past 7 Days   I have been able to laugh and see the funny side of things. 0    I have looked forward with enjoyment to things. 0    I have blamed myself unnecessarily when things went wrong. 0    I have been anxious or worried for no good reason. 0    I have felt scared or panicky for no good reason. 0    Things have been getting on top of me. 0     I have been so unhappy that I have had difficulty sleeping. 0    I have felt sad or miserable. 0    I have been so unhappy that I have been crying. 1    The thought of harming myself has occurred to me. 0    Edinburgh Postnatal Depression Scale Total 1            Assessment and Plan:  Postpartum state - overall doing well postpartum.  Will f/u at 6 week appointment.   History of anxiety - doing well. Has Buspar as needed. No signs of depression postpartum.   Hemorrhoids, unspecified hemorrhoid type - discussed sitz baths, can continue other treatment remedies.   Pelvic floor dysfunction in female - advised that she should note modest improvement by 6 weeks postpartum, however will also refer to pelvic floor physical therapy.    Follow Up Instructions:   Follow up in 4 weeks for final postpartum visit.   I discussed the assessment and treatment plan with the patient. The patient was provided an opportunity to ask questions and all were answered. The patient agreed with the plan and demonstrated an understanding of the instructions.   The patient was advised to call back or seek an in-person evaluation if the symptoms worsen or if the condition fails to improve as anticipated.  I provided 10 minutes of non-face-to-face time during this encounter.   03/03/21, MD [facility]

## 2021-01-11 ENCOUNTER — Other Ambulatory Visit: Payer: Self-pay

## 2021-01-11 ENCOUNTER — Ambulatory Visit: Payer: BC Managed Care – PPO | Attending: Obstetrics and Gynecology | Admitting: Physical Therapy

## 2021-01-11 ENCOUNTER — Encounter: Payer: Self-pay | Admitting: Physical Therapy

## 2021-01-11 DIAGNOSIS — M6281 Muscle weakness (generalized): Secondary | ICD-10-CM | POA: Insufficient documentation

## 2021-01-11 DIAGNOSIS — R102 Pelvic and perineal pain: Secondary | ICD-10-CM | POA: Diagnosis present

## 2021-01-11 DIAGNOSIS — R278 Other lack of coordination: Secondary | ICD-10-CM | POA: Diagnosis not present

## 2021-01-11 NOTE — Therapy (Signed)
[facility] [address] [place], [place], [address] Phone: [phone]   Fax:  [phone]  Physical Therapy Evaluation  Patient Details  Name: Terena Bohan MRN: [id number] Date of Birth: [birth year] Referring Provider (PT): Rubie Maid   Encounter Date: 01/11/2021   PT End of Session - 01/11/21 1842    Visit Number 1    Number of Visits 6    Date for PT Re-Evaluation 02/22/21    PT Start Time 1410    PT Stop Time 1455    PT Time Calculation (min) 45 min    Activity Tolerance Patient tolerated treatment well    Behavior During Therapy [facility] for tasks assessed/performed           Past Medical History:  Diagnosis Date  . Anxiety   . Cancer ([place])    basal cell carcinoma  . Depression     Past Surgical History:  Procedure Laterality Date  . LIPOMA EXCISION Left 2017   shoulder  . SKIN CANCER EXCISION      There were no vitals filed for this visit.        [facility] PT Assessment - 01/11/21 0001      Assessment   Medical Diagnosis Pelvic floor dysfunction    Referring Provider (PT) Rubie Maid    Onset Date/Surgical Date 12/17/20    Hand Dominance Right    Next MD Visit 01/31/2021    Prior Therapy None for PF      Balance Screen   Has the patient fallen in the past 6 months No           PELVIC HEALTH PHYSICAL THERAPY EVALUATION  SCREENING Red Flags: None Have you had any night sweats? Unexplained weight loss? Saddle anesthesia? Unexplained changes in bowel or bladder habits?  Precautions: 12/17/2020 SVD  SUBJECTIVE  Chief Complaint: Patient notes that when she did her 2 week check-in s/p SVD she had some concerns over lack of urinary control. Patient has been able to decrease pad size from 7 to 4 since that visit. She notes UI with positional changes. She has been doing bladder training since she has a lack of urge which has been effective. Patient notes that during pregnancy she did not have  issues with UI until the very end, but it still wasn't to the extent that she is currently experiencing. Patient notes that she noticed she had an urge to empty her bladder for the first time since delivery. She also notes that she requires less effort to empty bladder as time progresses and does not have to lean forward/use Crede's maneuver. Patient notes that she has trouble controlling the flow of urine; for example, when she unrobes for the shower she will notice leaking, but only when the urine is flowing down her leg.  Pertinent History:  Falls Negative.  Scoliosis Negative. Pulmonary disease/dysfunction Negative. Surgical history: Positive for see above.   Obstetrical History: G1P1 Deliveries: s/p vacuum-assisted vaginal delivery; complicated by prolonged labor and rupture of membranes greater than 24 hours Tearing/Episiotomy: grade 1  Gynecological History: Hysterectomy: No  Endometriosis: Negative Last Menstrual Period: 03/14/2020 Pain with exam: No   Urinary History: Incontinence: Positive. Onset: 12/17/2020 Triggers: positional changes, coughing/laughing/sneezing, walking. Amount: Min/Mod. Fluid Intake: 6-8 16.9 oz H20, 1 coffee caffeinated, 1 sodas Nocturia: 2x/night (not always with urge) Frequency of urination: every 3-4 hours Pain with urination: Negative Difficulty initiating urination: Negative Intermittent stream: Positive.  Gastrointestinal History: [place] Stool Chart: Type 4  Frequency of BMs: 3x/week Pain with defecation: Positive for hemorrhoids. Straining with defecation: Positive for hx. Incontinence: Negative.    Sexual activity/pain: Pain with intercourse: Negative.   Initial penetration: No  Deep thrustingNo   Location of pain: near Hart's line Current pain:  3/10  Max pain:  3/10 Least pain:  1/10 Pain quality: pain quality: burning Radiating pain: No   Current activities:  Sleep, socializing, go to the beach  Patient Goals:  "feeling like  I have control over my body"; "pre-pregnancy normal"   OBJECTIVE  Mental Status Patient is oriented to person, place and time.  Recent memory is intact.  Remote memory is intact.  Attention span and concentration are intact.  Expressive speech is intact.  Patient's fund of knowledge is within normal limits for educational level.  POSTURE/OBSERVATIONS:  Lumbar lordosis: mildly increased Iliac crest height: equal bilaterally Lumbar lateral shift: negative Pelvic obliquity: negative Leg length discrepancy: negative  GAIT: Grossly WNL  RANGE OF MOTION: deferred 2/2 to time constraints   LEFT RIGHT  Lumbar forward flexion (65):      Lumbar extension (30):     Lumbar lateral flexion (25):     Thoracic and Lumbar rotation (30 degrees):       Hip Flexion (0-125):      Hip IR (0-45):     Hip ER (0-45):     Hip Abduction (0-40):     Hip extension (0-15):       STRENGTH: MMT deferred 2/2 to time constraints  RLE LLE  Hip Flexion    Hip Extension    Hip Abduction     Hip Adduction     Hip ER     Hip IR     Knee Extension    Knee Flexion    Dorsiflexion     Plantarflexion (seated)     ABDOMINAL: deferred 2/2 to time constraints Palpation: Diastasis: Scar mobility: Rib flare:  SPECIAL TESTS:deferred 2/2 to time constraints  PHYSICAL PERFORMANCE MEASURES: STS: WNL  EXTERNAL PELVIC EXAM: deferred 2/2 to time constraints Palpation: Breath coordination: Cued Lengthen: Cued Contraction: Cough:     OUTCOME MEASURES: FOTO   ASSESSMENT Patient is a 31 year old presenting to clinic with chief complaints of urinary incontinence and vaginal pain s/p SVD. Upon evaluation, patient demonstrates deficits in bladder habits, IAP management, PFM coordination, PFM strength as evidenced by UI without awareness with positional changes, coughing, inconsistent urinary urge, difficulty emptying bladder, 3/10 pain at site of vaginal laceration. Patient's responses on FOTO outcome  measures (41) indicate significant functional limitations/disability/distress. Patient's progress may be limited due to caregiving demands; however, patient's motivation is advantageous. Patient was able to achieve basic understanding of PFM function during today's evaluation and responded positively to educational interventions. Patient will benefit from continued skilled therapeutic intervention to address deficits in  bladder habits, IAP management, PFM coordination, PFM strength in order to increase function and improve overall QOL.  EDUCATION Patient educated on prognosis, POC. Patient articulated understanding and returned demonstration. Patient will benefit from further education in order to maximize compliance and understanding for long-term therapeutic gains.  TREATMENT Neuromuscular Re-education: Patient educated on primary functions of the pelvic floor including: posture/balance, sexual pleasure, storage and elimination of waste from the body, abdominal cavity closure, and breath coordination.     Objective measurements completed on examination: See above findings.         PT Long Term Goals - 01/11/21 1852      PT LONG TERM GOAL #  1   Title Patient will demonstrate independence with HEP in order to maximize therapeutic gains and improve carryover from physical therapy sessions to ADLs in the home and community.    Baseline IE: not demonstrated    Time 6    Period Weeks    Status New    Target Date 02/22/21      PT LONG TERM GOAL #2   Title Patient will decrease worst pain as reported on NPRS by at least 2 points to demonstrate clinically significant reduction in pain in order to restore/improve function and overall QOL.    Baseline IE: 3/10    Time 6    Period Weeks    Status New    Target Date 02/22/21      PT LONG TERM GOAL #3   Title Patient will demonstrate improved function as evidenced by a score of 60 on FOTO measure for full participation in activities at home  and in the community.    Baseline IE: 41    Time 6    Period Weeks    Status New    Target Date 02/22/21      PT LONG TERM GOAL #4   Title Patient will demonstrate circumferential and sequential contraction of >4/5 MMT, > 6 sec hold x10 and 5 consecutive quick flicks with </= 10 min rest between testing bouts, and relaxation of the PFM coordinated with breath for improved management of intra-abdominal pressure and normal bowel and bladder function without the presence of pain nor incontinence in order to improve participation at home and in the community.    Baseline IE: not demonstrated    Time 6    Period Weeks    Status New    Target Date 02/22/21      PT LONG TERM GOAL #5   Title Patient will demonstrate coordinated lengthening and relaxation of PFM with diaphragmatic inhalation in order to decrease spasm and allow for unrestricted elimination of urine/feces for improved overall QOL.    Baseline IE: not demonstrated    Time 6    Period Weeks    Status New    Target Date 02/22/21                  Plan - 01/11/21 1842    Clinical Impression Statement Patient is a 31 year old presenting to clinic with chief complaints of urinary incontinence and vaginal pain s/p SVD. Upon evaluation, patient demonstrates deficits in bladder habits, IAP management, PFM coordination, PFM strength as evidenced by UI without awareness with positional changes, coughing, inconsistent urinary urge, difficulty emptying bladder, 3/10 pain at site of vaginal laceration. Patient's responses on FOTO outcome measures (41) indicate significant functional limitations/disability/distress. Patient's progress may be limited due to caregiving demands; however, patient's motivation is advantageous. Patient was able to achieve basic understanding of PFM function during today's evaluation and responded positively to educational interventions. Patient will benefit from continued skilled therapeutic intervention to  address deficits in  bladder habits, IAP management, PFM coordination, PFM strength in order to increase function and improve overall QOL.    Personal Factors and Comorbidities Age;Behavior Pattern;Comorbidity 2;Past/Current Experience;Profession    Comorbidities anxiety, depression, skin cancer    Examination-Activity Limitations Bed Mobility;Sleep;Transfers;Squat;Caring for Others;Stairs;Locomotion Level;Continence    Examination-Participation Restrictions Community Activity;Interpersonal Relationship;Cleaning;Laundry    Stability/Clinical Decision Making Evolving/Moderate complexity    Clinical Decision Making Moderate    Rehab Potential Good    PT Frequency 1x / week    PT Duration  6 weeks    PT Treatment/Interventions ADLs/Self Care Home Management;Cryotherapy;Electrical Stimulation;Moist Heat;Biofeedback;Joint Manipulations;Spinal Manipulations;Scar mobilization;Manual techniques;Patient/family education;Therapeutic exercise;Therapeutic activities;Neuromuscular re-education    PT Next Visit Plan physical assessment; PFM coordination basics    PT Home Exercise Plan not initiated    Consulted and Agree with Plan of Care Patient           Patient will benefit from skilled therapeutic intervention in order to improve the following deficits and impairments:  Pain,Impaired flexibility,Increased fascial restricitons,Decreased strength,Decreased coordination,Improper body mechanics,Decreased scar mobility,Decreased endurance  Visit Diagnosis: Other lack of coordination  Pelvic pain  Muscle weakness (generalized)     Problem List Patient Active Problem List   Diagnosis Date Noted  . Term pregnancy 12/16/2020  . PROM (premature rupture of membranes) 12/16/2020  . Obesity affecting pregnancy in third trimester 12/02/2020  . Pregnancy 09/21/2020  . Nausea 09/20/2020  . Anxiety during pregnancy 09/09/2020  . Echogenic focus of heart of fetus affecting antepartum care of mother  09/09/2020  . Rh negative state in antepartum period 07/12/2020   Myles Gip PT, DPT [phone]  01/11/2021, 6:53 PM  [facility] [address] [place], [place], [address] Phone: [phone]   Fax:  [phone]  Name: Traniya Prichett MRN: [id number] Date of Birth: 02/24/1990

## 2021-01-14 ENCOUNTER — Other Ambulatory Visit: Payer: Self-pay | Admitting: Obstetrics and Gynecology

## 2021-01-18 ENCOUNTER — Ambulatory Visit: Payer: BC Managed Care – PPO | Admitting: Physical Therapy

## 2021-01-18 ENCOUNTER — Encounter: Payer: Self-pay | Admitting: Physical Therapy

## 2021-01-18 ENCOUNTER — Other Ambulatory Visit: Payer: Self-pay

## 2021-01-18 DIAGNOSIS — R278 Other lack of coordination: Secondary | ICD-10-CM

## 2021-01-18 DIAGNOSIS — M6281 Muscle weakness (generalized): Secondary | ICD-10-CM

## 2021-01-18 DIAGNOSIS — R102 Pelvic and perineal pain: Secondary | ICD-10-CM

## 2021-01-18 NOTE — Therapy (Signed)
[facility] [address] [place], [place], [address] Phone: [phone]   Fax:  [phone]  Physical Therapy Treatment  Patient Details  Name: Kenita Bines MRN: [id number] Date of Birth: [birth year] Referring Provider (PT): Rubie Maid   Encounter Date: 01/18/2021   PT End of Session - 01/18/21 1355    Visit Number 2    Number of Visits 6    Date for PT Re-Evaluation 02/22/21    PT Start Time 1400    PT Stop Time 1455    PT Time Calculation (min) 55 min    Activity Tolerance Patient tolerated treatment well    Behavior During Therapy [facility] for tasks assessed/performed           Past Medical History:  Diagnosis Date  . Anxiety   . Cancer ([place])    basal cell carcinoma  . Depression     Past Surgical History:  Procedure Laterality Date  . LIPOMA EXCISION Left 2017   shoulder  . SKIN CANCER EXCISION      There were no vitals filed for this visit.   Subjective Assessment - 01/18/21 1357    Subjective Patient states that she is leaking very very little; she got brave and went without a pad yesterday and reports she was not super successful, but she only had minor dampness. She is no longer leaking with entering the shower. She also reports return of urge to void with good consistency and has discontinued timed voids. Patient also notes she has been practicing belly breathing.    Currently in Pain? Yes    Pain Score 3     Pain Location Genitalia    Pain Descriptors / Indicators Discomfort           TREATMENT  Pre-treatment assessment:  RANGE OF MOTION:    LEFT RIGHT  Lumbar forward flexion (65):  WNL    Lumbar extension (30): WNL    Lumbar lateral flexion (25):  WNL WNL  Thoracic and Lumbar rotation (30 degrees):    WNL WNL  Hip Flexion (0-125):   WNL WNL  Hip IR (0-45):  WNL WNL  Hip ER (0-45):  WNL WNL  Hip Abduction (0-40):  WNL WNL  Hip extension (0-15):  WNL WNL    STRENGTH: MMT   RLE LLE  Hip  Flexion 5 5  Hip Extension 5 5  Hip Abduction  5 5  Hip Adduction  5 5  Hip ER  5 5  Hip IR  5 5  Knee Extension 5 5  Knee Flexion 5 5  Dorsiflexion  5 5  Plantarflexion (seated) 5 5   ABDOMINAL:  Palpation: no TTP Diastasis: none present Scar mobility: good mobility noted in older abdominal scars Rib flare:   SPECIAL TESTS: Stork: Negative B   EXTERNAL PELVIC EXAM:  Palpation: TTP at perineum and scar site Breath coordination: present Cued Lengthen: unable to coordinate, abdominal compensations Cued Contraction: 3/5 MMT, x3 reps, difficulty relaxing PFM with increased repetitions Cough: not assessed  Neuromuscular Re-education: Supine hooklying diaphragmatic breathing with VCs and TCs for downregulation of the nervous system and improved management of IAP Supine hooklying, PFM lengthening with inhalation. VCs and TCs to decrease compensatory patterns and encourage optimal relaxation of the PFM. Patient educated on intra-abdominal pressure system and impacts on pressure from, breath patterns, PFM, and core musculature.   Patient educated throughout session on appropriate technique and form using multi-modal cueing, HEP, and activity modification. Patient  articulated understanding and returned demonstration.  ASSESSMENT Patient presents to clinic with excellent motivation to participate in therapy. Patient demonstrates deficits in bladder habits, IAP management, PFM coordination, PFM strength. Patient able to achieve breath coordination with PFM relaxation during today's session and responded positively to educational and active interventions. Patient will benefit from continued skilled therapeutic intervention to address remaining deficits in bladder habits, IAP management, PFM coordination, PFM strength in order to increase function, and improve overall QOL.      PT Long Term Goals - 01/11/21 1852      PT LONG TERM GOAL #1   Title Patient will demonstrate independence  with HEP in order to maximize therapeutic gains and improve carryover from physical therapy sessions to ADLs in the home and community.    Baseline IE: not demonstrated    Time 6    Period Weeks    Status New    Target Date 02/22/21      PT LONG TERM GOAL #2   Title Patient will decrease worst pain as reported on NPRS by at least 2 points to demonstrate clinically significant reduction in pain in order to restore/improve function and overall QOL.    Baseline IE: 3/10    Time 6    Period Weeks    Status New    Target Date 02/22/21      PT LONG TERM GOAL #3   Title Patient will demonstrate improved function as evidenced by a score of 60 on FOTO measure for full participation in activities at home and in the community.    Baseline IE: 41    Time 6    Period Weeks    Status New    Target Date 02/22/21      PT LONG TERM GOAL #4   Title Patient will demonstrate circumferential and sequential contraction of >4/5 MMT, > 6 sec hold x10 and 5 consecutive quick flicks with </= 10 min rest between testing bouts, and relaxation of the PFM coordinated with breath for improved management of intra-abdominal pressure and normal bowel and bladder function without the presence of pain nor incontinence in order to improve participation at home and in the community.    Baseline IE: not demonstrated    Time 6    Period Weeks    Status New    Target Date 02/22/21      PT LONG TERM GOAL #5   Title Patient will demonstrate coordinated lengthening and relaxation of PFM with diaphragmatic inhalation in order to decrease spasm and allow for unrestricted elimination of urine/feces for improved overall QOL.    Baseline IE: not demonstrated    Time 6    Period Weeks    Status New    Target Date 02/22/21                 Plan - 01/18/21 1355    Clinical Impression Statement Patient presents to clinic with excellent motivation to participate in therapy. Patient demonstrates deficits in bladder habits,  IAP management, PFM coordination, PFM strength. Patient able to achieve breath coordination with PFM relaxation during today's session and responded positively to educational and active interventions. Patient will benefit from continued skilled therapeutic intervention to address remaining deficits in bladder habits, IAP management, PFM coordination, PFM strength in order to increase function, and improve overall QOL.    Personal Factors and Comorbidities Age;Behavior Pattern;Comorbidity 2;Past/Current Experience;Profession    Comorbidities anxiety, depression, skin cancer    Examination-Activity Limitations Bed Mobility;Sleep;Transfers;Squat;Caring for Others;Stairs;Locomotion Level;Continence  Examination-Participation Restrictions Community Activity;Interpersonal Relationship;Cleaning;Laundry    Stability/Clinical Decision Making Evolving/Moderate complexity    Rehab Potential Good    PT Frequency 1x / week    PT Duration 6 weeks    PT Treatment/Interventions ADLs/Self Care Home Management;Cryotherapy;Electrical Stimulation;Moist Heat;Biofeedback;Joint Manipulations;Spinal Manipulations;Scar mobilization;Manual techniques;Patient/family education;Therapeutic exercise;Therapeutic activities;Neuromuscular re-education    PT Next Visit Plan physical assessment; PFM coordination basics    PT Home Exercise Plan not initiated    Consulted and Agree with Plan of Care Patient           Patient will benefit from skilled therapeutic intervention in order to improve the following deficits and impairments:  Pain,Impaired flexibility,Increased fascial restricitons,Decreased strength,Decreased coordination,Improper body mechanics,Decreased scar mobility,Decreased endurance  Visit Diagnosis: Pelvic pain  Other lack of coordination  Muscle weakness (generalized)     Problem List Patient Active Problem List   Diagnosis Date Noted  . Term pregnancy 12/16/2020  . PROM (premature rupture of  membranes) 12/16/2020  . Obesity affecting pregnancy in third trimester 12/02/2020  . Pregnancy 09/21/2020  . Nausea 09/20/2020  . Anxiety during pregnancy 09/09/2020  . Echogenic focus of heart of fetus affecting antepartum care of mother 09/09/2020  . Rh negative state in antepartum period 07/12/2020   Myles Gip PT, DPT [phone]  01/18/2021, 3:58 PM  [facility] [address] [place], [place], [address] Phone: [phone]   Fax:  [phone]  Name: Chasidi Webb MRN: [id number] Date of Birth: July 11, 1990

## 2021-01-25 ENCOUNTER — Encounter: Payer: BC Managed Care – PPO | Admitting: Physical Therapy

## 2021-01-31 ENCOUNTER — Encounter: Payer: Self-pay | Admitting: Obstetrics and Gynecology

## 2021-01-31 ENCOUNTER — Ambulatory Visit (INDEPENDENT_AMBULATORY_CARE_PROVIDER_SITE_OTHER): Payer: BC Managed Care – PPO | Admitting: Obstetrics and Gynecology

## 2021-01-31 ENCOUNTER — Other Ambulatory Visit: Payer: Self-pay

## 2021-01-31 DIAGNOSIS — Z30019 Encounter for initial prescription of contraceptives, unspecified: Secondary | ICD-10-CM | POA: Diagnosis not present

## 2021-01-31 DIAGNOSIS — M6289 Other specified disorders of muscle: Secondary | ICD-10-CM

## 2021-01-31 DIAGNOSIS — Z8659 Personal history of other mental and behavioral disorders: Secondary | ICD-10-CM

## 2021-01-31 DIAGNOSIS — L0232 Furuncle of buttock: Secondary | ICD-10-CM

## 2021-01-31 DIAGNOSIS — O9081 Anemia of the puerperium: Secondary | ICD-10-CM

## 2021-01-31 LAB — POCT URINE PREGNANCY: Preg Test, Ur: NEGATIVE

## 2021-01-31 MED ORDER — SERTRALINE HCL 50 MG PO TABS: 50.0000 mg | ORAL_TABLET | Freq: Every day | ORAL | 3 refills | Status: DC

## 2021-01-31 MED ORDER — DESOGESTREL-ETHINYL ESTRADIOL 0.15-0.02/0.01 MG (21/5) PO TABS: 1.0000 | ORAL_TABLET | Freq: Every day | ORAL | 3 refills | Status: DC

## 2021-01-31 NOTE — Progress Notes (Signed)
Pt present for PPV. Pt stated that she was breastfeeding, has a cyst on the out of vaginal area, and hurts to have a BM due to incisions from delivery. Pt requested OCP for birth control.  EPDS= 4.

## 2021-01-31 NOTE — Progress Notes (Signed)
OBSTETRICS POSTPARTUM CLINIC PROGRESS NOTE  Subjective:     Mandy Hughes is a 31 y.o. G31P1001 female who presents for a postpartum visit. She is 6 weeks postpartum following a low vacuum- delivery (arrest of descent, LGA infant with 8 lbs 15 oz). I have fully reviewed the prenatal and intrapartum course. The delivery was at 81 gestational weeks (IOL due to PROM at term).  Anesthesia: epidural. Postpartum course has overall been well. Baby's course has been well. Baby is feeding by both breast and bottle - Enfamil with Iron. Bleeding: patient has not resumed menses. Bowel function is abnormal: pain with bowel movements. Bladder function is abnormal: still experiencing some incontinence (but mostly with coughing/sneezing, and some urgency). She has begun seeing a pelvic floor physical therapist. Patient is not sexually active. Contraception method desired is OCP (estrogen/progesterone). Postpartum depression screening: negative (EPDS = 4), however patient requests increase in her Zoloft dosing (currently on 25 mg).  She notes feeling some symptoms of depression with being cooped inside with her baby and lack of social interaction due to [place] (and cold weather). Has h/o anxiety.   The following portions of the patient's history were reviewed and updated as appropriate: allergies, current medications, past family history, past medical history, past social history, past surgical history and problem list.   Review of Systems Pertinent items noted in HPI and remainder of comprehensive ROS otherwise negative.   Objective:    BP 122/86   Pulse (!) 105   Ht 5\' 4"  (1.626 m)   Wt 210 lb 3.2 oz (95.3 kg)   LMP 03/14/2020   Breastfeeding Yes   BMI 36.08 kg/m   General:  alert and no distress   Breasts:  inspection negative, no nipple discharge or bleeding, no masses or nodularity palpable  Lungs: clear to auscultation bilaterally  Heart:  regular rate and rhythm, S1, S2 normal, no murmur, click,  rub or gallop  Abdomen: soft, non-tender; bowel sounds normal; no masses,  no organomegaly.     Vulva:  normal  Vagina: normal vagina, no discharge, exudate, lesion, or erythema  Cervix:  no cervical motion tenderness and no lesions  Corpus: normal size, contour, position, consistency, mobility, non-tender  Adnexa:  normal adnexa and no mass, fullness, tenderness  Rectal Exam: Not performed.  Buttock with non-visible but palpable nodule on right.         Labs:  Lab Results  Component Value Date   HGB 9.1 (L) 12/20/2020    Assessment:   1. Postpartum care following vaginal delivery   2. Encounter for initial prescription of contraceptives, unspecified contraceptive   3. Postpartum anemia   4. Pelvic floor dysfunction   5. Boil of buttock      Plan:   1. Contraception: OCP (estrogen/progesterone). Will prescribe.  Can begin with Sunday start (has not yet had menses) 2. Will check Hgb for h/o anemia.  3. Continue with pelvic floor physical therapy for pelvic floor dysfunction and urinary incontinence postpartum. Has completed 2 visits so far. Notes she is doing well and enjoys it.  4. Advised on continuing warm compresses to buttock region.  5. Will increase Zoloft to 50 mg.  Also encouraged patient to participate in "mommy" support groups (can be done online or in person).  Can go for short walks.  Has some support at home but is still primary caregiver as husband works days and some nights.  6. Follow up in: 4-6 months for annual exam with Dr. Amalia Hailey  Rubie Maid, MD [facility]

## 2021-01-31 NOTE — Patient Instructions (Signed)
Oral Contraception Use Oral contraceptive pills (OCPs) are medicines that prevent pregnancy. OCPs work by:  Preventing the ovaries from releasing eggs.  Thickening mucus in the lower part of the uterus (cervix). This prevents sperm from entering the uterus.  Thinning the lining of the uterus (endometrium). This prevents a fertilized egg from attaching to the endometrium. Discuss possible side effects of OCPs with your health care provider. It can take 2-3 months for your body to adjust to changes in hormone levels. What are the risks? OCPs can sometimes cause side effects, such as:  Headache.  Depression.  Trouble sleeping.  Nausea and vomiting.  Breast tenderness.  Irregular bleeding or spotting during the first several months.  Bloating or fluid retention.  Increase in blood pressure. OCPs with estrogen and progestins may slightly increase the risk of:  Blood clots.  Heart attack.  Stroke. How to take OCPs Follow instructions from your health care provider about how to take your first cycle of OCPs. There are 2 types of OCPs. The first, combination OCPs, have both estrogen and progestins. The second, progestin-only pills, have only progestin.  For combination OCPs, you may start the pill: ? On day 1 of your menstrual period. ? On the first Sunday after your period starts, or on the day you get your prescription. ? At any time of your cycle. ? If you start taking the pill within 5 days after the start of your period, you will not need a backup form of birth control, such as condoms. ? If you start at any other time of your menstrual cycle, you will need to use a backup form of birth control.  For progestin-only OCPs: ? Ideally, you can start taking the pill on the first day of your menstrual period, but you can start it on any other day too. ? These pills will protect you from pregnancy after taking it for 2 days (48 hours). You can stop using a backup form of birth  control after that time. It is important that you take this pill at the same time every day. Even taking it 3 hours late can increase the risk of pregnancy. No matter which day you start the OCP, you will always start a new pack on that same day of the week. Have an extra pack of OCPs and a backup contraceptive method available in case you miss some pills or lose your OCP pack. Missed doses Follow instructions from your health care provider for missed doses. Information about missed doses can also be found in the patient information sheet that comes with your pack of pills.  In general, for combined OCPs: ? If you forget to take the pill for 1 day, take it as soon as you can. This may mean taking 2 pills on the same day and at the same time. Take the next day's pill at the regular time. ? If you forget to take the pill for 2 days in a row, take 2 tablets on the day you remember and 2 tablets on the following day. A backup form of birth control should be used for 7 days after you are back on schedule. ? If you forget to take the pill for 3 days in a row, call your health care provider for directions on when to restart taking your pills. Do not take the missed pills. A backup form of birth control will be needed for 7 days once you restart your pills. ? If you use a pack that  contains inactive pills and you miss 1 or more of the inactive pills, you do not need to take the missed doses. Skip them and start the new pack on the regular day.  For progestin-only OCPs: ? If your dose is 3 hours or more late, or if you miss 1 or more doses, take 1 missed pill as soon as you can. ? If you miss one or more doses, you must use a backup form of birth control. Some brands of progestin-only pills recommend using a backup form of birth control for 48 hours after a missed or late dose while others recommend 7 days. If you are not sure what to do, call your health care provider or check the patient information sheet that  came with your pills.   Follow these instructions at home:  Do not use any products that contain nicotine or tobacco. These include cigarettes, chewing tobacco, or vaping devices, such as e-cigarettes. If you need help quitting, ask your health care provider.  Always use a condom to protect against STIs (sexually transmitted infections). Oral contraception pills do not protect against STIs.  Use a calendar to mark the days of your menstrual period.  Read the information sheet and directions that came with your OCP. Talk to your health care provider if you have questions. Contact a health care provider if:  You develop nausea and vomiting.  You have abnormal vaginal discharge or bleeding.  You develop a rash.  You miss your menstrual period. Depending on the type of OCP you are taking, this may be a sign of pregnancy.  You are losing your hair.  You need treatment for mood swings or depression.  You get dizzy when taking the OCP.  You develop acne after taking the OCP.  You become pregnant or think you may be pregnant.  You have diarrhea, constipation, and abdominal pain or cramps.  You are not sure what to do after missing pills. Get help right away if:  You develop chest pain.  You develop shortness of breath.  You have an uncontrolled or severe headache.  You develop numbness or slurred speech.  You develop vision or speech problems.  You develop pain, redness, and swelling in your legs.  You develop weakness or numbness in your arms or legs. These symptoms may represent a serious problem that is an emergency. Do not wait to see if the symptoms will go away. Get medical help right away. Call your local emergency services (911 in the [place] Do not drive yourself to the hospital. Summary  Oral contraceptive pills (OCPs) are medicines that you take to prevent pregnancy.  OCPs do not prevent sexually transmitted infections (STIs). Always use a condom to protect  against STIs.  When you start an OCP, be aware that it can take 2-3 months for your body to adjust to changes in hormone levels.  Read all the information and directions that come with your OCP. This information is not intended to replace advice given to you by your health care provider. Make sure you discuss any questions you have with your health care provider. Document Revised: 08/25/2020 Document Reviewed: 08/25/2020 Elsevier Patient Education  [place].

## 2021-02-01 ENCOUNTER — Encounter: Payer: BC Managed Care – PPO | Admitting: Physical Therapy

## 2021-02-01 ENCOUNTER — Ambulatory Visit: Payer: BC Managed Care – PPO | Attending: Obstetrics and Gynecology | Admitting: Physical Therapy

## 2021-02-01 ENCOUNTER — Encounter: Payer: Self-pay | Admitting: Physical Therapy

## 2021-02-01 DIAGNOSIS — M6281 Muscle weakness (generalized): Secondary | ICD-10-CM | POA: Insufficient documentation

## 2021-02-01 DIAGNOSIS — R102 Pelvic and perineal pain: Secondary | ICD-10-CM | POA: Diagnosis not present

## 2021-02-01 DIAGNOSIS — R278 Other lack of coordination: Secondary | ICD-10-CM | POA: Insufficient documentation

## 2021-02-01 LAB — HEMOGLOBIN AND HEMATOCRIT, BLOOD
Hematocrit: 37 % (ref 34.0–46.6)
Hemoglobin: 12.5 g/dL (ref 11.1–15.9)

## 2021-02-01 NOTE — Therapy (Signed)
[facility] [address] [place], [place], [address] Phone: [phone]   Fax:  [phone]  Physical Therapy Treatment  Patient Details  Name: Mandy Hughes MRN: [id number] Date of Birth: 13-Dec-1990 Referring Provider (PT): Rubie Maid   Encounter Date: 02/01/2021   PT End of Session - 02/01/21 1456    Visit Number 3    Number of Visits 6    Date for PT Re-Evaluation 02/22/21    PT Start Time 1400    PT Stop Time 1455    PT Time Calculation (min) 55 min    Activity Tolerance Patient tolerated treatment well    Behavior During Therapy [facility] for tasks assessed/performed           Past Medical History:  Diagnosis Date  . Anxiety   . Cancer ([place])    basal cell carcinoma  . Depression     Past Surgical History:  Procedure Laterality Date  . LIPOMA EXCISION Left 2017   shoulder  . SKIN CANCER EXCISION      There were no vitals filed for this visit.   Subjective Assessment - 02/01/21 1408    Subjective Patient reports that she went to the OB yesterday and all her stitches had come out. Patient notes that she has increased pelvic pain wiht use of squatty potty. Patient notes she is having BMs 2x/day but as a result of incomplete emptying with AM BM. Patient note some reservations about returning to sexual activity considering increased pain wiht BMs.    Currently in Pain? No/denies           TREATMENT  Neuromuscular Re-education: Patient education on strategies for graded exposure to physical intimacy with partner to assess PFM response.  Supine knee to chest with PFM lengthening, BLE, for improved PFM spasm release Supine double knee to chest with PFM lengthening for improved PFM spasm release Supine butterfly with PFM lengthening, BLE, for improved PFM tissue length Supine hamstring stretch at wall with PFM lengthening, BLE, for improved PFM tissue length Supine adductor stretch at wall with PFM lengthening,  BLE, for improved PFM tissue length  Patient educated throughout session on appropriate technique and form using multi-modal cueing, HEP, and activity modification. Patient articulated understanding and returned demonstration.  ASSESSMENT Patient presents to clinic with excellent motivation to participate in therapy. Patient demonstrates deficits in bladder habits, IAP management, PFM coordination, PFM strength. Patient able to achieve tolerable stretch of PFM with stretches at wall during today's session and responded positively to educational and active interventions. Patient will benefit from continued skilled therapeutic intervention to address remaining deficits in bladder habits, IAP management, PFM coordination, PFM strength in order to increase function, and improve overall QOL.    PT Long Term Goals - 01/11/21 1852      PT LONG TERM GOAL #1   Title Patient will demonstrate independence with HEP in order to maximize therapeutic gains and improve carryover from physical therapy sessions to ADLs in the home and community.    Baseline IE: not demonstrated    Time 6    Period Weeks    Status New    Target Date 02/22/21      PT LONG TERM GOAL #2   Title Patient will decrease worst pain as reported on NPRS by at least 2 points to demonstrate clinically significant reduction in pain in order to restore/improve function and overall QOL.    Baseline IE: 3/10    Time 6  Period Weeks    Status New    Target Date 02/22/21      PT LONG TERM GOAL #3   Title Patient will demonstrate improved function as evidenced by a score of 60 on FOTO measure for full participation in activities at home and in the community.    Baseline IE: 41    Time 6    Period Weeks    Status New    Target Date 02/22/21      PT LONG TERM GOAL #4   Title Patient will demonstrate circumferential and sequential contraction of >4/5 MMT, > 6 sec hold x10 and 5 consecutive quick flicks with </= 10 min rest between  testing bouts, and relaxation of the PFM coordinated with breath for improved management of intra-abdominal pressure and normal bowel and bladder function without the presence of pain nor incontinence in order to improve participation at home and in the community.    Baseline IE: not demonstrated    Time 6    Period Weeks    Status New    Target Date 02/22/21      PT LONG TERM GOAL #5   Title Patient will demonstrate coordinated lengthening and relaxation of PFM with diaphragmatic inhalation in order to decrease spasm and allow for unrestricted elimination of urine/feces for improved overall QOL.    Baseline IE: not demonstrated    Time 6    Period Weeks    Status New    Target Date 02/22/21                 Plan - 02/01/21 1725    Clinical Impression Statement Patient presents to clinic with excellent motivation to participate in therapy. Patient demonstrates deficits in bladder habits, IAP management, PFM coordination, PFM strength. Patient able to achieve tolerable stretch of PFM with stretches at wall during today's session and responded positively to educational and active interventions. Patient will benefit from continued skilled therapeutic intervention to address remaining deficits in bladder habits, IAP management, PFM coordination, PFM strength in order to increase function, and improve overall QOL.    Personal Factors and Comorbidities Age;Behavior Pattern;Comorbidity 2;Past/Current Experience;Profession    Comorbidities anxiety, depression, skin cancer    Examination-Activity Limitations Bed Mobility;Sleep;Transfers;Squat;Caring for Others;Stairs;Locomotion Level;Continence    Examination-Participation Restrictions Community Activity;Interpersonal Relationship;Cleaning;Laundry    Stability/Clinical Decision Making Evolving/Moderate complexity    Rehab Potential Good    PT Frequency 1x / week    PT Duration 6 weeks    PT Treatment/Interventions ADLs/Self Care Home  Management;Cryotherapy;Electrical Stimulation;Moist Heat;Biofeedback;Joint Manipulations;Spinal Manipulations;Scar mobilization;Manual techniques;Patient/family education;Therapeutic exercise;Therapeutic activities;Neuromuscular re-education    PT Next Visit Plan internal work    PT [place] not initiated    Consulted and Agree with Plan of Care Patient           Patient will benefit from skilled therapeutic intervention in order to improve the following deficits and impairments:  Pain,Impaired flexibility,Increased fascial restricitons,Decreased strength,Decreased coordination,Improper body mechanics,Decreased scar mobility,Decreased endurance  Visit Diagnosis: Pelvic pain  Other lack of coordination  Muscle weakness (generalized)     Problem List Patient Active Problem List   Diagnosis Date Noted  . Nausea 09/20/2020  . History of anxiety 09/09/2020   Myles Gip PT, DPT [phone]  02/01/2021, 5:31 PM  [facility] [address] [place], [place], [address] Phone: [phone]   Fax:  [phone]  Name: Mandy Hughes MRN: [id number] Date of Birth: May 15, 1990

## 2021-02-08 ENCOUNTER — Other Ambulatory Visit: Payer: Self-pay

## 2021-02-08 ENCOUNTER — Ambulatory Visit: Payer: BC Managed Care – PPO | Admitting: Physical Therapy

## 2021-02-08 ENCOUNTER — Encounter: Payer: Self-pay | Admitting: Physical Therapy

## 2021-02-08 DIAGNOSIS — R278 Other lack of coordination: Secondary | ICD-10-CM

## 2021-02-08 DIAGNOSIS — R102 Pelvic and perineal pain: Secondary | ICD-10-CM

## 2021-02-08 DIAGNOSIS — M6281 Muscle weakness (generalized): Secondary | ICD-10-CM

## 2021-02-08 NOTE — Therapy (Signed)
[facility] [address] [place], [place], [address] Phone: [phone]   Fax:  [phone]  Physical Therapy Treatment  Patient Details  Name: Mandy Hughes MRN: [id number] Date of Birth: 1990-09-10 Referring Provider (PT): Rubie Maid   Encounter Date: 02/08/2021   PT End of Session - 02/08/21 1355    Visit Number 4    Number of Visits 6    Date for PT Re-Evaluation 02/22/21    PT Start Time 1400    PT Stop Time 1455    PT Time Calculation (min) 55 min    Activity Tolerance Patient tolerated treatment well    Behavior During Therapy [facility] for tasks assessed/performed           Past Medical History:  Diagnosis Date  . Anxiety   . Cancer ([facility])    basal cell carcinoma  . Depression     Past Surgical History:  Procedure Laterality Date  . LIPOMA EXCISION Left 2017   shoulder  . SKIN CANCER EXCISION      There were no vitals filed for this visit.   Subjective Assessment - 02/08/21 1357    Subjective Patient reports that after last session she had pelvic floor muscle soreness that lasted 2 days. Patient ntoes it felt like muscle soreness from a workout. Patient only did stretches one other time since last vsisit. Patient notes BMs are easier and not as painful. Patient is not leaking as much; occasionally with a cough/sneeze. Patient has continued to work on breathing.    Currently in Pain? No/denies          TREATMENT Manual Therapy: STM and TPR performed to B gluteal, hamstring, and adductor complexes to allow for decreased tension and pain and improved posture and function with vibratory and percussive device  Neuromuscular Re-education: Supine double knee to chest with PFM lengthening for improved PFM spasm release Supine butterfly with PFM lengthening, BLE, for improved PFM tissue length Supine anterior pelvic tilts with coordinated breath for improved PFM tissue length Seated anterior pelvic tilts with  coordinated breath for improved PFM tissue length  Patient educated throughout session on appropriate technique and form using multi-modal cueing, HEP, and activity modification. Patient articulated understanding and returned demonstration.  ASSESSMENT Patient presents to clinic with excellent motivation to participate in therapy. Patient demonstrates deficits in bladder habits, IAP management, PFM coordination, PFM strength. Patient with no adverse reaction to manual and gentle PFM stretches during today's session and responded positively to manual and active interventions. Patient will benefit from continued skilled therapeutic intervention to address remaining deficits in bladder habits, IAP management, PFM coordination, PFM strength in order to increase function, and improve overall QOL.     PT Long Term Goals - 01/11/21 1852      PT LONG TERM GOAL #1   Title Patient will demonstrate independence with HEP in order to maximize therapeutic gains and improve carryover from physical therapy sessions to ADLs in the home and community.    Baseline IE: not demonstrated    Time 6    Period Weeks    Status New    Target Date 02/22/21      PT LONG TERM GOAL #2   Title Patient will decrease worst pain as reported on NPRS by at least 2 points to demonstrate clinically significant reduction in pain in order to restore/improve function and overall QOL.    Baseline IE: 3/10    Time 6    Period  Weeks    Status New    Target Date 02/22/21      PT LONG TERM GOAL #3   Title Patient will demonstrate improved function as evidenced by a score of 60 on FOTO measure for full participation in activities at home and in the community.    Baseline IE: 41    Time 6    Period Weeks    Status New    Target Date 02/22/21      PT LONG TERM GOAL #4   Title Patient will demonstrate circumferential and sequential contraction of >4/5 MMT, > 6 sec hold x10 and 5 consecutive quick flicks with </= 10 min rest  between testing bouts, and relaxation of the PFM coordinated with breath for improved management of intra-abdominal pressure and normal bowel and bladder function without the presence of pain nor incontinence in order to improve participation at home and in the community.    Baseline IE: not demonstrated    Time 6    Period Weeks    Status New    Target Date 02/22/21      PT LONG TERM GOAL #5   Title Patient will demonstrate coordinated lengthening and relaxation of PFM with diaphragmatic inhalation in order to decrease spasm and allow for unrestricted elimination of urine/feces for improved overall QOL.    Baseline IE: not demonstrated    Time 6    Period Weeks    Status New    Target Date 02/22/21                 Plan - 02/08/21 1355    Clinical Impression Statement Patient presents to clinic with excellent motivation to participate in therapy. Patient demonstrates deficits in bladder habits, IAP management, PFM coordination, PFM strength. Patient with no adverse reaction to manual and gentle PFM stretches during today's session and responded positively to manual and active interventions. Patient will benefit from continued skilled therapeutic intervention to address remaining deficits in bladder habits, IAP management, PFM coordination, PFM strength in order to increase function, and improve overall QOL.    Personal Factors and Comorbidities Age;Behavior Pattern;Comorbidity 2;Past/Current Experience;Profession    Comorbidities anxiety, depression, skin cancer    Examination-Activity Limitations Bed Mobility;Sleep;Transfers;Squat;Caring for Others;Stairs;Locomotion Level;Continence    Examination-Participation Restrictions Community Activity;Interpersonal Relationship;Cleaning;Laundry    Stability/Clinical Decision Making Evolving/Moderate complexity    Clinical Decision Making Moderate    Rehab Potential Good    PT Frequency 1x / week    PT Duration 6 weeks    PT  Treatment/Interventions ADLs/Self Care Home Management;Cryotherapy;Electrical Stimulation;Moist Heat;Biofeedback;Joint Manipulations;Spinal Manipulations;Scar mobilization;Manual techniques;Patient/family education;Therapeutic exercise;Therapeutic activities;Neuromuscular re-education    PT Next Visit Plan internal work    PT [place] not initiated    Consulted and Agree with Plan of Care Patient           Patient will benefit from skilled therapeutic intervention in order to improve the following deficits and impairments:  Pain,Impaired flexibility,Increased fascial restricitons,Decreased strength,Decreased coordination,Improper body mechanics,Decreased scar mobility,Decreased endurance  Visit Diagnosis: Pelvic pain  Other lack of coordination  Muscle weakness (generalized)     Problem List Patient Active Problem List   Diagnosis Date Noted  . Nausea 09/20/2020  . History of anxiety 09/09/2020   Myles Gip PT, DPT [phone]  02/08/2021, 5:40 PM  [facility] [address] [place], [place], [address] Phone: [phone]   Fax:  [phone]  Name: Mandy Hughes MRN: [id number] Date of Birth: [birth year]

## 2021-02-10 MED ORDER — NORETHINDRONE 0.35 MG PO TABS: 1.0000 | ORAL_TABLET | Freq: Every day | ORAL | 3 refills | Status: AC

## 2021-02-10 MED ORDER — SLYND 4 MG PO TABS: 1.0000 | ORAL_TABLET | Freq: Every day | ORAL | 3 refills | Status: DC

## 2021-02-10 NOTE — Addendum Note (Signed)
Addended by: Augusto Gamble on: 02/10/2021 02:43 PM   Modules accepted: Orders

## 2021-02-15 ENCOUNTER — Other Ambulatory Visit: Payer: Self-pay

## 2021-02-15 ENCOUNTER — Encounter: Payer: Self-pay | Admitting: Physical Therapy

## 2021-02-15 ENCOUNTER — Ambulatory Visit: Payer: BC Managed Care – PPO | Admitting: Physical Therapy

## 2021-02-15 DIAGNOSIS — R278 Other lack of coordination: Secondary | ICD-10-CM

## 2021-02-15 DIAGNOSIS — R102 Pelvic and perineal pain: Secondary | ICD-10-CM

## 2021-02-15 DIAGNOSIS — M6281 Muscle weakness (generalized): Secondary | ICD-10-CM

## 2021-02-15 NOTE — Therapy (Signed)
[place] [facility] [address] [place], [place], [address] Phone: [phone]   Fax:  [phone]  Physical Therapy Treatment  Patient Details  Name: Mandy Hughes MRN: [id number] Date of Birth: [birth year] Referring Provider (PT): Rubie Maid   Encounter Date: 02/15/2021   PT End of Session - 02/15/21 1409    Visit Number 5    Number of Visits 6    Date for PT Re-Evaluation 02/22/21    PT Start Time 0177    PT Stop Time 1450    PT Time Calculation (min) 53 min    Activity Tolerance Patient tolerated treatment well    Behavior During Therapy [facility] for tasks assessed/performed           Past Medical History:  Diagnosis Date  . Anxiety   . Cancer ([place])    basal cell carcinoma  . Depression     Past Surgical History:  Procedure Laterality Date  . LIPOMA EXCISION Left 2017   shoulder  . SKIN CANCER EXCISION      There were no vitals filed for this visit.   Subjective Assessment - 02/15/21 1401    Subjective Patient notes that she has to have further biopsy of skin on her breast, as a result she has discontinued breast feeding. Patient denies any other significant changes; notes she has been able to do PFM stretches without pain.    Currently in Pain? No/denies           TREATMENT Neuromuscular Re-education: Patient educated on circular model of female sexual response for improved understanding of biological and physiological function for return to sexual activity. Patient educated on strategies for partner intimacy that do not include penetrative sex: sensate focus, emotional intimacy building activities, and mindfulness practices.  Patient educated throughout session on appropriate technique and form using multi-modal cueing, HEP, and activity modification. Patient articulated understanding and returned demonstration.  ASSESSMENT Patient presents to clinic with excellent motivation to participate in therapy.  Patient demonstrates deficits in bladder habits, IAP management, PFM coordination, PFM strength. Patient with good receptivity to educational materials/discussion during today's session and responded positively to all  interventions. Patient will benefit from continued skilled therapeutic intervention to address remaining deficits in bladder habits, IAP management, PFM coordination, PFM strength in order to increase function, and improve overall QOL.      PT Long Term Goals - 01/11/21 1852      PT LONG TERM GOAL #1   Title Patient will demonstrate independence with HEP in order to maximize therapeutic gains and improve carryover from physical therapy sessions to ADLs in the home and community.    Baseline IE: not demonstrated    Time 6    Period Weeks    Status New    Target Date 02/22/21      PT LONG TERM GOAL #2   Title Patient will decrease worst pain as reported on NPRS by at least 2 points to demonstrate clinically significant reduction in pain in order to restore/improve function and overall QOL.    Baseline IE: 3/10    Time 6    Period Weeks    Status New    Target Date 02/22/21      PT LONG TERM GOAL #3   Title Patient will demonstrate improved function as evidenced by a score of 60 on FOTO measure for full participation in activities at home and in the community.    Baseline IE: 41    Time  6    Period Weeks    Status New    Target Date 02/22/21      PT LONG TERM GOAL #4   Title Patient will demonstrate circumferential and sequential contraction of >4/5 MMT, > 6 sec hold x10 and 5 consecutive quick flicks with </= 10 min rest between testing bouts, and relaxation of the PFM coordinated with breath for improved management of intra-abdominal pressure and normal bowel and bladder function without the presence of pain nor incontinence in order to improve participation at home and in the community.    Baseline IE: not demonstrated    Time 6    Period Weeks    Status New     Target Date 02/22/21      PT LONG TERM GOAL #5   Title Patient will demonstrate coordinated lengthening and relaxation of PFM with diaphragmatic inhalation in order to decrease spasm and allow for unrestricted elimination of urine/feces for improved overall QOL.    Baseline IE: not demonstrated    Time 6    Period Weeks    Status New    Target Date 02/22/21                 Plan - 02/15/21 1410    Clinical Impression Statement Patient presents to clinic with excellent motivation to participate in therapy. Patient demonstrates deficits in bladder habits, IAP management, PFM coordination, PFM strength. Patient with good receptivity to educational materials/discussion during today's session and responded positively to all  interventions. Patient will benefit from continued skilled therapeutic intervention to address remaining deficits in bladder habits, IAP management, PFM coordination, PFM strength in order to increase function, and improve overall QOL.    Personal Factors and Comorbidities Age;Behavior Pattern;Comorbidity 2;Past/Current Experience;Profession    Comorbidities anxiety, depression, skin cancer    Examination-Activity Limitations Bed Mobility;Sleep;Transfers;Squat;Caring for Others;Stairs;Locomotion Level;Continence    Examination-Participation Restrictions Community Activity;Interpersonal Relationship;Cleaning;Laundry    Stability/Clinical Decision Making Evolving/Moderate complexity    Rehab Potential Good    PT Frequency 1x / week    PT Duration 6 weeks    PT Treatment/Interventions ADLs/Self Care Home Management;Cryotherapy;Electrical Stimulation;Moist Heat;Biofeedback;Joint Manipulations;Spinal Manipulations;Scar mobilization;Manual techniques;Patient/family education;Therapeutic exercise;Therapeutic activities;Neuromuscular re-education    PT Next Visit Plan internal work    PT [place] not initiated    Consulted and Agree with Plan of Care Patient            Patient will benefit from skilled therapeutic intervention in order to improve the following deficits and impairments:  Pain,Impaired flexibility,Increased fascial restricitons,Decreased strength,Decreased coordination,Improper body mechanics,Decreased scar mobility,Decreased endurance  Visit Diagnosis: Pelvic pain  Other lack of coordination  Muscle weakness (generalized)     Problem List Patient Active Problem List   Diagnosis Date Noted  . Nausea 09/20/2020  . History of anxiety 09/09/2020   Myles Gip PT, DPT [phone]  02/15/2021, 3:01 PM  [place] [facility] [address] [place], [place], [address] Phone: [phone]   Fax:  [phone]  Name: Mandy Hughes MRN: [id number] Date of Birth: 18-May-1990

## 2021-02-22 ENCOUNTER — Encounter: Payer: Self-pay | Admitting: Physical Therapy

## 2021-02-22 ENCOUNTER — Ambulatory Visit: Payer: BC Managed Care – PPO | Admitting: Physical Therapy

## 2021-02-22 ENCOUNTER — Other Ambulatory Visit: Payer: Self-pay

## 2021-02-22 DIAGNOSIS — R278 Other lack of coordination: Secondary | ICD-10-CM

## 2021-02-22 DIAGNOSIS — R102 Pelvic and perineal pain: Secondary | ICD-10-CM

## 2021-02-22 DIAGNOSIS — M6281 Muscle weakness (generalized): Secondary | ICD-10-CM

## 2021-02-22 NOTE — Therapy (Signed)
[place] [facility] [address] [place], [place], [address] Phone: [phone]   Fax:  [phone]  Physical Therapy Treatment  Patient Details  Name: Mandy Hughes MRN: [id number] Date of Birth: 1990/03/28 Referring Provider (PT): Rubie Maid   Encounter Date: 02/22/2021   PT End of Session - 02/22/21 1828    Visit Number 6    Number of Visits 6    Date for PT Re-Evaluation 02/22/21    PT Start Time 1410    PT Stop Time 1450    PT Time Calculation (min) 40 min    Activity Tolerance Patient tolerated treatment well    Behavior During Therapy [facility] for tasks assessed/performed           Past Medical History:  Diagnosis Date  . Anxiety   . Cancer ([place])    basal cell carcinoma  . Depression     Past Surgical History:  Procedure Laterality Date  . LIPOMA EXCISION Left 2017   shoulder  . SKIN CANCER EXCISION      There were no vitals filed for this visit.   Subjective Assessment - 02/22/21 1824    Subjective Patient presents with partner, Eddie Dibbles, for instruction on scar massage at home. Patient notes that her pain at rest without direct pressure at scar site is mostly if not completely absent. Patient denies any other significant changes. patient and partner have been applying techniques for nervous system down-regulation with some success.    Currently in Pain? No/denies           TREATMENT Manual Therapy: Scar mobilization at R labia to allow for improved mobility and function. Patient educated on the purpose of the pelvic exam and articulated understanding; patient consented to the exam verbally. Patient and partner education on scar massage techniques. Partner observed DPT using scar massage techniques on patient with patient consent. Partner educated on location of scar site, amount of pressure used, and importance of starting distally and progressing to more direct scar massage. Patient and partner educated on  importance of monitoring pain within session of scar massage, use of lidocaine post-massage, use of stretches and relaxation techniques prior.  Patient educated throughout session on appropriate technique and form using multi-modal cueing, HEP, and activity modification. Patient articulated understanding and returned demonstration.  ASSESSMENT Patient presents to clinic with excellent motivation to participate in therapy. Patient demonstrates deficits in bladder habits, IAP management, PFM coordination, PFM strength. Patient tolerated scar massage well with mild-moderate increase in pain/burning during today's session with 1/10 discomfort at end of session. Patient will benefit from continued skilled therapeutic intervention to address remaining deficits in bladder habits, IAP management, PFM coordination, PFM strength in order to increase function, and improve overall QOL.       PT Long Term Goals - 01/11/21 1852      PT LONG TERM GOAL #1   Title Patient will demonstrate independence with HEP in order to maximize therapeutic gains and improve carryover from physical therapy sessions to ADLs in the home and community.    Baseline IE: not demonstrated    Time 6    Period Weeks    Status New    Target Date 02/22/21      PT LONG TERM GOAL #2   Title Patient will decrease worst pain as reported on NPRS by at least 2 points to demonstrate clinically significant reduction in pain in order to restore/improve function and overall QOL.    Baseline IE: 3/10  Time 6    Period Weeks    Status New    Target Date 02/22/21      PT LONG TERM GOAL #3   Title Patient will demonstrate improved function as evidenced by a score of 60 on FOTO measure for full participation in activities at home and in the community.    Baseline IE: 41    Time 6    Period Weeks    Status New    Target Date 02/22/21      PT LONG TERM GOAL #4   Title Patient will demonstrate circumferential and sequential contraction  of >4/5 MMT, > 6 sec hold x10 and 5 consecutive quick flicks with </= 10 min rest between testing bouts, and relaxation of the PFM coordinated with breath for improved management of intra-abdominal pressure and normal bowel and bladder function without the presence of pain nor incontinence in order to improve participation at home and in the community.    Baseline IE: not demonstrated    Time 6    Period Weeks    Status New    Target Date 02/22/21      PT LONG TERM GOAL #5   Title Patient will demonstrate coordinated lengthening and relaxation of PFM with diaphragmatic inhalation in order to decrease spasm and allow for unrestricted elimination of urine/feces for improved overall QOL.    Baseline IE: not demonstrated    Time 6    Period Weeks    Status New    Target Date 02/22/21                 Plan - 02/22/21 1828    Clinical Impression Statement Patient presents to clinic with excellent motivation to participate in therapy. Patient demonstrates deficits in bladder habits, IAP management, PFM coordination, PFM strength. Patient tolerated scar massage well with mild-moderate increase in pain/burning during today's session with 1/10 discomfort at end of session. Patient will benefit from continued skilled therapeutic intervention to address remaining deficits in bladder habits, IAP management, PFM coordination, PFM strength in order to increase function, and improve overall QOL.    Personal Factors and Comorbidities Age;Behavior Pattern;Comorbidity 2;Past/Current Experience;Profession    Comorbidities anxiety, depression, skin cancer    Examination-Activity Limitations Bed Mobility;Sleep;Transfers;Squat;Caring for Others;Stairs;Locomotion Level;Continence    Examination-Participation Restrictions Community Activity;Interpersonal Relationship;Cleaning;Laundry    Stability/Clinical Decision Making Evolving/Moderate complexity    Rehab Potential Good    PT Frequency 1x / week    PT  Duration 6 weeks    PT Treatment/Interventions ADLs/Self Care Home Management;Cryotherapy;Electrical Stimulation;Moist Heat;Biofeedback;Joint Manipulations;Spinal Manipulations;Scar mobilization;Manual techniques;Patient/family education;Therapeutic exercise;Therapeutic activities;Neuromuscular re-education    PT Next Visit Plan internal work    PT [place] not initiated    Consulted and Agree with Plan of Care Patient           Patient will benefit from skilled therapeutic intervention in order to improve the following deficits and impairments:  Pain,Impaired flexibility,Increased fascial restricitons,Decreased strength,Decreased coordination,Improper body mechanics,Decreased scar mobility,Decreased endurance  Visit Diagnosis: Pelvic pain  Other lack of coordination  Muscle weakness (generalized)     Problem List Patient Active Problem List   Diagnosis Date Noted  . Nausea 09/20/2020  . History of anxiety 09/09/2020   Myles Gip PT, DPT [phone]  02/22/2021, 6:35 PM  [place] [facility] [address] [place], [place], [address] Phone: [phone]   Fax:  [phone]  Name: Mandy Hughes MRN: [id number] Date of Birth: 1990-11-14

## 2021-03-01 ENCOUNTER — Encounter: Payer: Self-pay | Admitting: Physical Therapy

## 2021-03-01 ENCOUNTER — Other Ambulatory Visit: Payer: Self-pay

## 2021-03-01 ENCOUNTER — Ambulatory Visit: Payer: BC Managed Care – PPO | Attending: Obstetrics and Gynecology | Admitting: Physical Therapy

## 2021-03-01 DIAGNOSIS — R102 Pelvic and perineal pain: Secondary | ICD-10-CM | POA: Insufficient documentation

## 2021-03-01 DIAGNOSIS — R278 Other lack of coordination: Secondary | ICD-10-CM | POA: Insufficient documentation

## 2021-03-01 DIAGNOSIS — M6281 Muscle weakness (generalized): Secondary | ICD-10-CM | POA: Insufficient documentation

## 2021-03-01 NOTE — Therapy (Signed)
[place] [facility] [address] [place], [place], [address] Phone: [phone]   Fax:  [phone]  Physical Therapy- Visit Arrived & Cancelled  Patient Details  Name: Mandy Hughes MRN: [id number] Date of Birth: 08-May-1990 Referring Provider (PT): Rubie Maid   Encounter Date: 03/01/2021   PT End of Session - 03/01/21 1419    Visit Number 6    Number of Visits 6    Date for PT Re-Evaluation 02/22/21    Activity Tolerance Patient tolerated treatment well    Behavior During Therapy [facility] for tasks assessed/performed           Past Medical History:  Diagnosis Date  . Anxiety   . Cancer ([place])    basal cell carcinoma  . Depression     Past Surgical History:  Procedure Laterality Date  . LIPOMA EXCISION Left 2017   shoulder  . SKIN CANCER EXCISION      There were no vitals filed for this visit.   Subjective Assessment - 03/01/21 1401    Subjective Patient notes that with her skin cancer surgery last Friday she has been stressed. She is on lifting restrictions and unable to lift her child, but can hold him. Patient also started menstruation today/yesterday. Patient notes that with the surgery, stress, and her period, she has not had a chance to do any scar massage. She was able to insert a tampon without issue, but does note some irritation at scar site with presence of tampon. In light of recent surgery and patient deferring internal manual work in the context of menstruation, DPT and patient agree to reschedule today's visit to next week.    Currently in Pain? Yes    Pain Score 2     Pain Location Genitalia                PT Long Term Goals - 01/11/21 1852      PT LONG TERM GOAL #1   Title Patient will demonstrate independence with HEP in order to maximize therapeutic gains and improve carryover from physical therapy sessions to ADLs in the home and community.    Baseline IE: not demonstrated    Time 6    Period  Weeks    Status New    Target Date 02/22/21      PT LONG TERM GOAL #2   Title Patient will decrease worst pain as reported on NPRS by at least 2 points to demonstrate clinically significant reduction in pain in order to restore/improve function and overall QOL.    Baseline IE: 3/10    Time 6    Period Weeks    Status New    Target Date 02/22/21      PT LONG TERM GOAL #3   Title Patient will demonstrate improved function as evidenced by a score of 60 on FOTO measure for full participation in activities at home and in the community.    Baseline IE: 41    Time 6    Period Weeks    Status New    Target Date 02/22/21      PT LONG TERM GOAL #4   Title Patient will demonstrate circumferential and sequential contraction of >4/5 MMT, > 6 sec hold x10 and 5 consecutive quick flicks with </= 10 min rest between testing bouts, and relaxation of the PFM coordinated with breath for improved management of intra-abdominal pressure and normal bowel and bladder function without the presence of pain nor incontinence  in order to improve participation at home and in the community.    Baseline IE: not demonstrated    Time 6    Period Weeks    Status New    Target Date 02/22/21      PT LONG TERM GOAL #5   Title Patient will demonstrate coordinated lengthening and relaxation of PFM with diaphragmatic inhalation in order to decrease spasm and allow for unrestricted elimination of urine/feces for improved overall QOL.    Baseline IE: not demonstrated    Time 6    Period Weeks    Status New    Target Date 02/22/21                  Patient will benefit from skilled therapeutic intervention in order to improve the following deficits and impairments:     Visit Diagnosis: Pelvic pain  Other lack of coordination  Muscle weakness (generalized)     Problem List Patient Active Problem List   Diagnosis Date Noted  . Nausea 09/20/2020  . History of anxiety 09/09/2020   Myles Gip PT,  DPT [phone]  03/01/2021, 2:21 PM  [place] [facility] [address] [place], [place], [address] Phone: [phone]   Fax:  [phone]  Name: Mandy Hughes MRN: [id number] Date of Birth: 08/24/1990

## 2021-03-08 ENCOUNTER — Ambulatory Visit: Payer: BC Managed Care – PPO | Admitting: Physical Therapy

## 2021-03-15 ENCOUNTER — Encounter: Payer: Self-pay | Admitting: Physical Therapy

## 2021-03-15 ENCOUNTER — Ambulatory Visit: Payer: BC Managed Care – PPO | Admitting: Physical Therapy

## 2021-03-15 ENCOUNTER — Other Ambulatory Visit: Payer: Self-pay

## 2021-03-15 DIAGNOSIS — R278 Other lack of coordination: Secondary | ICD-10-CM | POA: Diagnosis present

## 2021-03-15 DIAGNOSIS — R102 Pelvic and perineal pain: Secondary | ICD-10-CM | POA: Diagnosis not present

## 2021-03-15 DIAGNOSIS — M6281 Muscle weakness (generalized): Secondary | ICD-10-CM | POA: Diagnosis present

## 2021-03-15 NOTE — Therapy (Signed)
[place] [facility] [address] [place], [place], [address] Phone: [phone]   Fax:  [phone]  Physical Therapy Treatment  Patient Details  Name: Mandy Hughes MRN: [id number] Date of Birth: 1990/10/15 Referring Provider (PT): Rubie Maid   Encounter Date: 03/15/2021   PT End of Session - 03/15/21 1415    Visit Number 7    Number of Visits 12    Date for PT Re-Evaluation 03/15/21    PT Start Time 1400    PT Stop Time 1455    PT Time Calculation (min) 55 min    Activity Tolerance Patient tolerated treatment well    Behavior During Therapy [facility] for tasks assessed/performed           Past Medical History:  Diagnosis Date  . Anxiety   . Cancer ([place])    basal cell carcinoma  . Depression     Past Surgical History:  Procedure Laterality Date  . LIPOMA EXCISION Left 2017   shoulder  . SKIN CANCER EXCISION      There were no vitals filed for this visit.   Subjective Assessment - 03/15/21 1403    Subjective Patient notes she is not having any UI with coughing/laughing/sneezing. No nocturia and no issues with BMs. Patient has returned to penetrative sex without pain during, but noted some muscle soreness after (2/10) for about 24 hours. Patient was able to control pain wiht lidocaine.    Currently in Pain? No/denies           TREATMENT Neuromuscular Re-education: Reassessed goals; see below. Supine hooklying diaphragmatic breathing with VCs and TCs for downregulation of the nervous system and improved management of IAP Supine hooklying, PFM lengthening with inhalation. VCs and TCs to decrease compensatory patterns and encourage optimal relaxation of the PFM. Supine hooklying, PFM contractions with exhalation. VCs and TCs to decrease compensatory patterns and encourage activation of the PFM.     Patient educated throughout session on appropriate technique and form using multi-modal cueing, HEP, and activity  modification. Patient articulated understanding and returned demonstration.  ASSESSMENT Patient presents to clinic with excellent motivation to participate in therapy. Patient demonstrates deficits in bladder habits, IAP management, PFM coordination, PFM strength. Patient indicating achievement of 3/4 goals during today's session and is appropriate to progress with PFM strengthening. Patient has responded positively to manual, educational, and neuromuscular re-education interventions throughout course of care. Patient's condition has the potential to improve in response to therapy. Maximum improvement is yet to be obtained. The anticipated improvement is attainable and reasonable in a generally predictable time. Patient will benefit from continued skilled therapeutic intervention to address remaining deficits in bladder habits, IAP management, PFM coordination, PFM strength in order to increase function, and improve overall QOL.     PT Long Term Goals - 03/15/21 1417      PT LONG TERM GOAL #1   Title Patient will demonstrate independence with HEP in order to maximize therapeutic gains and improve carryover from physical therapy sessions to ADLs in the home and community.    Baseline IE: not demonstrated; 3/16: IND    Time 6    Period Weeks    Status Achieved      PT LONG TERM GOAL #2   Title Patient will decrease worst pain as reported on NPRS by at least 2 points to demonstrate clinically significant reduction in pain in order to restore/improve function and overall QOL.    Baseline IE: 3/10; 3/16: 2/10  Time 6    Period Weeks    Status On-going    Target Date 04/26/21      PT LONG TERM GOAL #3   Title Patient will demonstrate improved function as evidenced by a score of 60 on FOTO measure for full participation in activities at home and in the community.    Baseline IE: 41; 3/16: 70    Time 6    Period Weeks    Status Achieved      PT LONG TERM GOAL #4   Title Patient will  demonstrate circumferential and sequential contraction of >4/5 MMT, > 6 sec hold x10 and 5 consecutive quick flicks with </= 10 min rest between testing bouts, and relaxation of the PFM coordinated with breath for improved management of intra-abdominal pressure and normal bowel and bladder function without the presence of pain nor incontinence in order to improve participation at home and in the community.    Baseline IE: not demonstrated; 1/85: 4/5, 5 quick flicks    Time 6    Period Weeks    Status On-going    Target Date 04/26/21      PT LONG TERM GOAL #5   Title Patient will demonstrate coordinated lengthening and relaxation of PFM with diaphragmatic inhalation in order to decrease spasm and allow for unrestricted elimination of urine/feces for improved overall QOL.    Baseline IE: not demonstrated; 3/16: IND    Time 6    Period Weeks    Status Achieved    Target Date --                 Plan - 03/15/21 1417    Clinical Impression Statement Patient presents to clinic with excellent motivation to participate in therapy. Patient demonstrates deficits in bladder habits, IAP management, PFM coordination, PFM strength. Patient indicating achievement of 3/4 goals during today's session and is appropriate to progress with PFM strengthening. Patient has responded positively to manual, educational, and neuromuscular re-education interventions throughout course of care. Patient's condition has the potential to improve in response to therapy. Maximum improvement is yet to be obtained. The anticipated improvement is attainable and reasonable in a generally predictable time. Patient will benefit from continued skilled therapeutic intervention to address remaining deficits in bladder habits, IAP management, PFM coordination, PFM strength in order to increase function, and improve overall QOL.    Personal Factors and Comorbidities Age;Behavior Pattern;Comorbidity 2;Past/Current Experience;Profession     Comorbidities anxiety, depression, skin cancer    Examination-Activity Limitations Bed Mobility;Sleep;Transfers;Squat;Caring for Others;Stairs;Locomotion Level;Continence    Examination-Participation Restrictions Community Activity;Interpersonal Relationship;Cleaning;Laundry    Stability/Clinical Decision Making Evolving/Moderate complexity    Rehab Potential Good    PT Frequency 1x / week    PT Duration 6 weeks    PT Treatment/Interventions ADLs/Self Care Home Management;Cryotherapy;Electrical Stimulation;Moist Heat;Biofeedback;Joint Manipulations;Spinal Manipulations;Scar mobilization;Manual techniques;Patient/family education;Therapeutic exercise;Therapeutic activities;Neuromuscular re-education    PT Next Visit Plan Reassess PFM strength & progress    PT Home Exercise Plan PFM Fast Twitch    Consulted and Agree with Plan of Care Patient           Patient will benefit from skilled therapeutic intervention in order to improve the following deficits and impairments:  Pain,Impaired flexibility,Increased fascial restricitons,Decreased strength,Decreased coordination,Improper body mechanics,Decreased scar mobility,Decreased endurance  Visit Diagnosis: Pelvic pain - Plan: PT plan of care cert/re-cert  Other lack of coordination - Plan: PT plan of care cert/re-cert  Muscle weakness (generalized) - Plan: PT plan of care cert/re-cert  Problem List Patient Active Problem List   Diagnosis Date Noted  . Nausea 09/20/2020  . History of anxiety 09/09/2020   Myles Gip PT, DPT [phone]  03/15/2021, 3:22 PM  [place] [facility] [address] [place], [place], [address] Phone: [phone]   Fax:  [phone]  Name: Mandy Hughes MRN: [id number] Date of Birth: [birth year]

## 2021-03-22 ENCOUNTER — Encounter: Payer: Self-pay | Admitting: Physical Therapy

## 2021-03-30 ENCOUNTER — Ambulatory Visit: Payer: BC Managed Care – PPO | Admitting: Physical Therapy

## 2021-04-05 ENCOUNTER — Ambulatory Visit: Payer: BC Managed Care – PPO | Attending: Obstetrics and Gynecology | Admitting: Physical Therapy

## 2021-04-05 ENCOUNTER — Encounter: Payer: Self-pay | Admitting: Physical Therapy

## 2021-04-05 ENCOUNTER — Other Ambulatory Visit: Payer: Self-pay

## 2021-04-05 DIAGNOSIS — M6281 Muscle weakness (generalized): Secondary | ICD-10-CM | POA: Insufficient documentation

## 2021-04-05 DIAGNOSIS — R278 Other lack of coordination: Secondary | ICD-10-CM | POA: Diagnosis present

## 2021-04-05 DIAGNOSIS — R102 Pelvic and perineal pain: Secondary | ICD-10-CM | POA: Insufficient documentation

## 2021-04-05 NOTE — Therapy (Signed)
[place] [facility] [address] [place], [place], [address] Phone: [phone]   Fax:  [phone]  Physical Therapy Treatment/Discharge  Patient Details  Name: Mandy Hughes MRN: [id number] Date of Birth: [birth year] Referring Provider (PT): Rubie Maid   Encounter Date: 04/05/2021   PT End of Session - 04/05/21 1643    Visit Number 8    Number of Visits 18    Date for PT Re-Evaluation 04/26/21    PT Start Time 1630    PT Stop Time 1710    PT Time Calculation (min) 40 min    Activity Tolerance Patient tolerated treatment well    Behavior During Therapy [facility] for tasks assessed/performed           Past Medical History:  Diagnosis Date  . Anxiety   . Cancer ([place])    basal cell carcinoma  . Depression     Past Surgical History:  Procedure Laterality Date  . LIPOMA EXCISION Left 2017   shoulder  . SKIN CANCER EXCISION      There were no vitals filed for this visit.   Subjective Assessment - 04/05/21 1635    Subjective Patient notes that she has been doing well. Patient only notes UI with sneezing in sitting with minimal amount. Patient continues to have some difficulty with relaxation of PFM after orgasm.    Currently in Pain? No/denies            TREATMENT Neuromuscular Re-education: Reassessed goals; see below. Patient education on PFM training progressions and what to do if signs and symptoms return.     Patient educated throughout session on appropriate technique and form using multi-modal cueing, HEP, and activity modification. Patient articulated understanding and returned demonstration.  ASSESSMENT Patient presents to clinic with excellent motivation to participate in therapy and notes confidence in ability to self-manage. Patient demonstrates negligible deficits in bladder habits, IAP management, PFM coordination, PFM strength. Patient has achieved all goals to a sufficient level to maintain progress  independently. Patient has responded positively to manual, educational, and neuromuscular re-education interventions throughout course of care. Patient is appropriate to discharge to self-management of bladder habits, IAP management, PFM coordination, PFM strength in order to maintain improved function and overall QOL.      PT Long Term Goals - 04/05/21 1644      PT LONG TERM GOAL #1   Title Patient will demonstrate independence with HEP in order to maximize therapeutic gains and improve carryover from physical therapy sessions to ADLs in the home and community.    Baseline IE: not demonstrated; 3/16: IND    Time 6    Period Weeks    Status Achieved      PT LONG TERM GOAL #2   Title Patient will decrease worst pain as reported on NPRS by at least 2 points to demonstrate clinically significant reduction in pain in order to restore/improve function and overall QOL.    Baseline IE: 3/10; 3/16: 2/10; 4/6: 0/10    Time 6    Period Weeks    Status Achieved      PT LONG TERM GOAL #3   Title Patient will demonstrate improved function as evidenced by a score of 60 on FOTO measure for full participation in activities at home and in the community.    Baseline IE: 41; 3/16: 70; 4/6: 98    Time 6    Period Weeks    Status Achieved      PT  LONG TERM GOAL #4   Title Patient will demonstrate circumferential and sequential contraction of >4/5 MMT, > 6 sec hold x10 and 5 consecutive quick flicks with </= 10 min rest between testing bouts, and relaxation of the PFM coordinated with breath for improved management of intra-abdominal pressure and normal bowel and bladder function without the presence of pain nor incontinence in order to improve participation at home and in the community.    Baseline IE: not demonstrated; 0/24: 4/5, 5 quick flicks,    Time 6    Period Weeks    Status Not Met      PT LONG TERM GOAL #5   Title Patient will demonstrate coordinated lengthening and relaxation of PFM with  diaphragmatic inhalation in order to decrease spasm and allow for unrestricted elimination of urine/feces for improved overall QOL.    Baseline IE: not demonstrated; 3/16: IND    Time 6    Period Weeks    Status Achieved                 Plan - 04/05/21 1644    Clinical Impression Statement Patient presents to clinic with excellent motivation to participate in therapy and notes confidence in ability to self-manage. Patient demonstrates negligible deficits in bladder habits, IAP management, PFM coordination, PFM strength. Patient has achieved all goals to a sufficient level to maintain progress independently. Patient has responded positively to manual, educational, and neuromuscular re-education interventions throughout course of care. Patient is appropriate to discharge to self-management of bladder habits, IAP management, PFM coordination, PFM strength in order to maintain improved function and overall QOL.    Personal Factors and Comorbidities Age;Behavior Pattern;Comorbidity 2;Past/Current Experience;Profession    Comorbidities anxiety, depression, skin cancer    Examination-Activity Limitations Bed Mobility;Sleep;Transfers;Squat;Caring for Others;Stairs;Locomotion Level;Continence    Examination-Participation Restrictions Community Activity;Interpersonal Relationship;Cleaning;Laundry    Stability/Clinical Decision Making Evolving/Moderate complexity    Rehab Potential Good    PT Frequency 1x / week    PT Duration 6 weeks    PT Treatment/Interventions ADLs/Self Care Home Management;Cryotherapy;Electrical Stimulation;Moist Heat;Biofeedback;Joint Manipulations;Spinal Manipulations;Scar mobilization;Manual techniques;Patient/family education;Therapeutic exercise;Therapeutic activities;Neuromuscular re-education    PT Next Visit Plan --    PT Home Exercise Plan --    Consulted and Agree with Plan of Care Patient           Patient will benefit from skilled therapeutic intervention in  order to improve the following deficits and impairments:  Pain,Impaired flexibility,Increased fascial restricitons,Decreased strength,Decreased coordination,Improper body mechanics,Decreased scar mobility,Decreased endurance  Visit Diagnosis: Pelvic pain  Other lack of coordination  Muscle weakness (generalized)     Problem List Patient Active Problem List   Diagnosis Date Noted  . Nausea 09/20/2020  . History of anxiety 09/09/2020   Myles Gip PT, DPT [phone]  04/05/2021, 5:45 PM  [place] [facility] [address] [place], [place], [address] Phone: [phone]   Fax:  [phone]  Name: Yukari Flax MRN: [id number] Date of Birth: 1990/08/19

## 2021-04-12 ENCOUNTER — Ambulatory Visit: Payer: BC Managed Care – PPO | Admitting: Physical Therapy

## 2021-04-13 ENCOUNTER — Ambulatory Visit: Payer: BC Managed Care – PPO | Admitting: Physical Therapy

## 2021-04-19 ENCOUNTER — Encounter: Payer: Self-pay | Admitting: Physical Therapy

## 2021-07-04 ENCOUNTER — Ambulatory Visit (INDEPENDENT_AMBULATORY_CARE_PROVIDER_SITE_OTHER): Payer: BC Managed Care – PPO | Admitting: Obstetrics and Gynecology

## 2021-07-04 ENCOUNTER — Other Ambulatory Visit: Payer: Self-pay

## 2021-07-04 ENCOUNTER — Other Ambulatory Visit (HOSPITAL_COMMUNITY)
Admission: RE | Admit: 2021-07-04 | Discharge: 2021-07-04 | Disposition: A | Discharge status: Home / Self Care | Payer: BC Managed Care – PPO | Source: Ambulatory Visit | Attending: Obstetrics and Gynecology | Admitting: Obstetrics and Gynecology

## 2021-07-04 ENCOUNTER — Encounter: Payer: Self-pay | Admitting: Obstetrics and Gynecology

## 2021-07-04 VITALS — BP 134/91 | HR 97 | Ht 64.0 in | Wt 201.6 lb

## 2021-07-04 DIAGNOSIS — Z124 Encounter for screening for malignant neoplasm of cervix: Secondary | ICD-10-CM | POA: Diagnosis not present

## 2021-07-04 DIAGNOSIS — Z01419 Encounter for gynecological examination (general) (routine) without abnormal findings: Secondary | ICD-10-CM | POA: Diagnosis not present

## 2021-07-04 DIAGNOSIS — F419 Anxiety disorder, unspecified: Secondary | ICD-10-CM | POA: Diagnosis not present

## 2021-07-04 DIAGNOSIS — O99340 Other mental disorders complicating pregnancy, unspecified trimester: Secondary | ICD-10-CM | POA: Diagnosis not present

## 2021-07-04 MED ORDER — DESOGESTREL-ETHINYL ESTRADIOL 0.15-0.02/0.01 MG (21/5) PO TABS: 1.0000 | ORAL_TABLET | Freq: Every day | ORAL | 3 refills | Status: AC

## 2021-07-04 MED ORDER — SERTRALINE HCL 50 MG PO TABS: 50.0000 mg | ORAL_TABLET | Freq: Every day | ORAL | 3 refills | Status: DC

## 2021-07-04 NOTE — Progress Notes (Signed)
HPI:      Mandy Hughes is a 31 y.o. G1P1001 who LMP was No LMP recorded.  Subjective:   She presents today for her annual examination.  She has no complaints.  She is taking OCPs and having regular cycles.  She recently had another spot removed and it was found to again be melanoma.  She had this completely excised.     Hx: The following portions of the patient's history were reviewed and updated as appropriate:             She  has a past medical history of Anxiety, Cancer ([place]), and Depression. She does not have any pertinent problems on file. She  has a past surgical history that includes Lipoma excision (Left, 2017) and Skin cancer excision. Her family history includes Bipolar disorder in her sister; Cancer in her maternal grandfather; Migraines in her sister; Personality disorder in her sister; Seizures in her father. She  reports that she quit smoking about 3 years ago. She has never used smokeless tobacco. She reports current alcohol use. She reports that she does not use drugs. She has a current medication list which includes the following prescription(s): buspirone, desogestrel-ethinyl estradiol, norethindrone, prenatal mv & min w/fa-dha, and sertraline. She is allergic to latex and tape.       Review of Systems:  Review of Systems  Constitutional: Denied constitutional symptoms, night sweats, recent illness, fatigue, fever, insomnia and weight loss.  Eyes: Denied eye symptoms, eye pain, photophobia, vision change and visual disturbance.  Ears/Nose/Throat/Neck: Denied ear, nose, throat or neck symptoms, hearing loss, nasal discharge, sinus congestion and sore throat.  Cardiovascular: Denied cardiovascular symptoms, arrhythmia, chest pain/pressure, edema, exercise intolerance, orthopnea and palpitations.  Respiratory: Denied pulmonary symptoms, asthma, pleuritic pain, productive sputum, cough, dyspnea and wheezing.  Gastrointestinal: Denied, gastro-esophageal reflux,  melena, nausea and vomiting.  Genitourinary: Denied genitourinary symptoms including symptomatic vaginal discharge, pelvic relaxation issues, and urinary complaints.  Musculoskeletal: Denied musculoskeletal symptoms, stiffness, swelling, muscle weakness and myalgia.  Dermatologic: Denied dermatology symptoms, rash and scar.  Neurologic: Denied neurology symptoms, dizziness, headache, neck pain and syncope.  Psychiatric: Denied psychiatric symptoms, anxiety and depression.  Endocrine: Denied endocrine symptoms including hot flashes and night sweats.   Meds:   Current Outpatient Medications on File Prior to Visit  Medication Sig Dispense Refill   busPIRone (BUSPAR) 15 MG tablet Take 15 mg by mouth 3 (three) times daily. As needed     norethindrone (MICRONOR) 0.35 MG tablet Take 1 tablet (0.35 mg total) by mouth daily. 84 tablet 3   Prenatal MV & Min w/FA-DHA (PRENATAL ADULT GUMMY/DHA/FA PO) Take by mouth. (Patient not taking: Reported on 07/04/2021)     No current facility-administered medications on file prior to visit.        The pregnancy intention screening data noted above was reviewed. Potential methods of contraception were discussed. The patient elected to proceed with Oral Contraceptive.     Objective:     Vitals:   07/04/21 1433  BP: (!) 134/91  Pulse: 97    Filed Weights   07/04/21 1433  Weight: 201 lb 9.6 oz (91.4 kg)              Physical examination General NAD, Conversant  HEENT Atraumatic; Op clear with mmm.  Normo-cephalic. Pupils reactive. Anicteric sclerae  Thyroid/Neck Smooth without nodularity or enlargement. Normal ROM.  Neck Supple.  Skin No rashes, lesions or ulceration. Normal palpated skin turgor. No nodularity.  Breasts: No  masses or discharge.  Symmetric.  No axillary adenopathy.  Lungs: Clear to auscultation.No rales or wheezes. Normal Respiratory effort, no retractions.  Heart: NSR.  No murmurs or rubs appreciated. No periferal edema  Abdomen:  Soft.  Non-tender.  No masses.  No HSM. No hernia  Extremities: Moves all appropriately.  Normal ROM for age. No lymphadenopathy.  Neuro: Oriented to PPT.  Normal mood. Normal affect.     Pelvic:   Vulva: Normal appearance.  No lesions.  Vagina: No lesions or abnormalities noted.  Support: Normal pelvic support.  Urethra No masses tenderness or scarring.  Meatus Normal size without lesions or prolapse.  Cervix: Normal appearance.  No lesions.  Anus: Normal exam.  No lesions.  Perineum: Normal exam.  No lesions.        Bimanual   Uterus: Normal size.  Non-tender.  Mobile.  AV.  Adnexae: No masses.  Non-tender to palpation.  Cul-de-sac: Negative for abnormality.     Assessment:    G1P1001 Patient Active Problem List   Diagnosis Date Noted   Nausea 09/20/2020   History of anxiety 09/09/2020     1. Well woman exam with routine gynecological exam   2. Screening for cervical cancer   3. Anxiety during pregnancy     Patient doing well postpartum.  She would like to continue both her OCPs and Zoloft.   Plan:            1.  Basic Screening Recommendations The basic screening recommendations for asymptomatic women were discussed with the patient during her visit.  The age-appropriate recommendations were discussed with her and the rational for the tests reviewed.  When I am informed by the patient that another primary care physician has previously obtained the age-appropriate tests and they are up-to-date, only outstanding tests are ordered and referrals given as necessary.  Abnormal results of tests will be discussed with her when all of her results are completed.  Routine preventative health maintenance measures emphasized: Exercise/Diet/Weight control, Tobacco Warnings, Alcohol/Substance use risks and Stress Management Pap performed 2.  Continue Zoloft for anxiety 3.  Continue OCPs Orders No orders of the defined types were placed in this encounter.    Meds ordered this  encounter  Medications   sertraline (ZOLOFT) 50 MG tablet    Sig: Take 1 tablet (50 mg total) by mouth daily.    Dispense:  90 tablet    Refill:  3   desogestrel-ethinyl estradiol (VIORELE) 0.15-0.02/0.01 MG (21/5) tablet    Sig: Take 1 tablet by mouth daily.    Dispense:  84 tablet    Refill:  3            F/U  Return in about 1 year (around 07/04/2022) for Annual Physical.  Finis Bud, M.D. 07/04/2021 3:23 PM

## 2021-07-07 LAB — CYTOLOGY - PAP
Comment: NEGATIVE
Diagnosis: NEGATIVE
High risk HPV: NEGATIVE

## 2021-07-26 ENCOUNTER — Other Ambulatory Visit: Payer: Self-pay | Admitting: Physician Assistant

## 2021-07-26 DIAGNOSIS — R1031 Right lower quadrant pain: Secondary | ICD-10-CM

## 2021-07-28 ENCOUNTER — Ambulatory Visit: Payer: 59

## 2021-09-27 ENCOUNTER — Other Ambulatory Visit: Payer: Self-pay | Admitting: Obstetrics and Gynecology

## 2021-09-27 DIAGNOSIS — O99340 Other mental disorders complicating pregnancy, unspecified trimester: Secondary | ICD-10-CM

## 2021-09-27 DIAGNOSIS — F419 Anxiety disorder, unspecified: Secondary | ICD-10-CM

## 2021-09-27 MED ORDER — CITALOPRAM HYDROBROMIDE 20 MG PO TABS: 20.0000 mg | ORAL_TABLET | Freq: Every day | ORAL | 12 refills | Status: AC

## 2022-01-28 ENCOUNTER — Other Ambulatory Visit: Payer: Self-pay | Admitting: Obstetrics and Gynecology

## 2022-01-29 MED ORDER — BUSPIRONE HCL 15 MG PO TABS: 15.0000 mg | ORAL_TABLET | Freq: Three times a day (TID) | ORAL | 0 refills | Status: AC

## 2022-10-15 ENCOUNTER — Encounter: Payer: Self-pay | Admitting: Obstetrics and Gynecology

## 2023-12-05 ENCOUNTER — Encounter: Payer: Self-pay | Admitting: Emergency Medicine

## 2023-12-05 ENCOUNTER — Ambulatory Visit
Admission: EM | Admit: 2023-12-05 | Discharge: 2023-12-05 | Disposition: A | Discharge status: Home / Self Care | Payer: BC Managed Care – PPO | Attending: Emergency Medicine | Admitting: Emergency Medicine

## 2023-12-05 DIAGNOSIS — J069 Acute upper respiratory infection, unspecified: Secondary | ICD-10-CM

## 2023-12-05 LAB — RESP PANEL BY RT-PCR (FLU A&B, COVID) ARPGX2
Influenza A by PCR: NEGATIVE
Influenza B by PCR: NEGATIVE
SARS Coronavirus 2 by RT PCR: NEGATIVE

## 2023-12-05 NOTE — ED Triage Notes (Signed)
Pt presents with a low grade fever, cough and congestion x 3 days. Her son was diagnosed with pneumonia 3 days ago.

## 2023-12-05 NOTE — Discharge Instructions (Addendum)
Your covid and flu are negative, your oxygen is 100% on room air.   Most likely you have a viral illness: no antibiotic is indicated at this time, May treat with OTC meds of choice(delsym,mucinex,hot tea,etc). Make sure to drink plenty of fluids to stay hydrated(gatorade, water, popsicles,jello,etc), avoid caffeine products. Follow up with PCP. Return as needed.

## 2023-12-05 NOTE — ED Provider Notes (Signed)
[facility]    CSN: [id number] Arrival date & time: 12/05/23  4098      History   Chief Complaint Chief Complaint  Patient presents with   Cough   Fever   Nasal Congestion    HPI Mandy Hughes is a 33 y.o. female.   33 year old female, Mandy Hughes, presents to urgent care for evaluation of low-grade fever last night,  cough and congestion for 3 days.  Patient states her son was diagnosed with pneumonia 3 days prior. Pt works as a Engineer, civil (consulting).  Past medical history is basal cell carcinoma depression anxiety, previous smoker quit 2018.  The history is provided by the patient. No language interpreter was used.    Past Medical History:  Diagnosis Date   Anxiety    Cancer (HCC)    basal cell carcinoma   Depression     Patient Active Problem List   Diagnosis Date Noted   Viral URI with cough 12/05/2023   Nausea 09/20/2020   History of anxiety 09/09/2020    Past Surgical History:  Procedure Laterality Date   LIPOMA EXCISION Left 2017   shoulder   SKIN CANCER EXCISION      OB History     Gravida  1   Para  1   Term  1   Preterm  0   AB  0   Living  1      SAB  0   IAB  0   Ectopic  0   Multiple  0   Live Births  0            Home Medications    Prior to Admission medications   Medication Sig Start Date End Date Taking? Authorizing Provider  azelastine (ASTELIN) 0.1 % nasal spray 1 spray into each nostril two (2) times a day. Use in each nostril as directed 02/01/23 02/01/24 Yes [provider]  busPIRone (BUSPAR) 5 MG tablet Take by mouth. 02/27/23  Yes [provider]  Sulfacetamide Sodium-Sulfur 8-4 % SUSP Apply topically 2 (two) times daily. 11/19/23  Yes [provider]  citalopram (CELEXA) 20 MG tablet Take 1 tablet (20 mg total) by mouth daily. 09/27/21   Linzie Collin, MD  desogestrel-ethinyl estradiol (VIORELE) 0.15-0.02/0.01 MG (21/5) tablet Take 1 tablet by mouth daily. 07/04/21 07/04/22   Linzie Collin, MD  norethindrone (MICRONOR) 0.35 MG tablet Take 1 tablet (0.35 mg total) by mouth daily. 02/10/21   Hildred Laser, MD  Prenatal MV & Min w/FA-DHA (PRENATAL ADULT GUMMY/DHA/FA PO) Take by mouth. Patient not taking: Reported on 07/04/2021    [provider]    Family History Family History  Problem Relation Age of Onset   Seizures Father        Stepdad   Cancer Maternal Grandfather    Migraines Sister    Bipolar disorder Sister    Personality disorder Sister     Social History Social History   Tobacco Use   Smoking status: Former    Current packs/day: 0.00    Types: Cigarettes    Quit date: 07/13/2017    Years since quitting: 6.4   Smokeless tobacco: Never  Vaping Use   Vaping status: Never Used  Substance Use Topics   Alcohol use: Yes    Comment: rarely   Drug use: No     Allergies   Latex, Tape, and Tapentadol   Review of Systems Review of Systems  Constitutional:  Positive for fever.  HENT:  Positive for congestion.   Respiratory:  Positive for cough. Negative for shortness of breath, wheezing and stridor.   Cardiovascular:  Negative for chest pain and palpitations.  Gastrointestinal:  Negative for abdominal pain.  All other systems reviewed and are negative.    Physical Exam Triage Vital Signs ED Triage Vitals  Encounter Vitals Group     BP 12/05/23 0839 123/85     Systolic BP Percentile --      Diastolic BP Percentile --      Pulse Rate 12/05/23 0839 94     Resp 12/05/23 0839 16     Temp 12/05/23 0839 98.4 F (36.9 C)     Temp Source 12/05/23 0839 Oral     SpO2 12/05/23 0839 100 %     Weight --      Height --      Head Circumference --      Peak Flow --      Pain Score 12/05/23 0837 4     Pain Loc --      Pain Education --      Exclude from Growth Chart --    No data found.  Updated Vital Signs BP 123/85 (BP Location: Left Arm)   Pulse 94   Temp 98.4 F (36.9 C) (Oral)   Resp 16   LMP 12/05/2023   SpO2 100%    Visual Acuity Right Eye Distance:   Left Eye Distance:   Bilateral Distance:    Right Eye Near:   Left Eye Near:    Bilateral Near:     Physical Exam Vitals and nursing note reviewed.  Constitutional:      General: She is not in acute distress.    Appearance: She is well-developed.  HENT:     Head: Normocephalic.     Right Ear: Tympanic membrane is retracted.     Left Ear: Tympanic membrane is retracted.     Nose: Congestion present.     Mouth/Throat:     Lips: Pink.     Mouth: Mucous membranes are moist.     Pharynx: Oropharynx is clear.  Eyes:     General: Lids are normal.     Conjunctiva/sclera: Conjunctivae normal.     Pupils: Pupils are equal, round, and reactive to light.  Neck:     Trachea: No tracheal deviation.  Cardiovascular:     Rate and Rhythm: Normal rate and regular rhythm.     Pulses: Normal pulses.     Heart sounds: Normal heart sounds. No murmur heard. Pulmonary:     Effort: Pulmonary effort is normal.     Breath sounds: Normal breath sounds and air entry.     Comments: Sat 100% on RA Abdominal:     General: Bowel sounds are normal.     Palpations: Abdomen is soft.     Tenderness: There is no abdominal tenderness.  Musculoskeletal:        General: Normal range of motion.     Cervical back: Normal range of motion.  Lymphadenopathy:     Cervical: No cervical adenopathy.  Skin:    General: Skin is warm and dry.     Findings: No rash.  Neurological:     General: No focal deficit present.     Mental Status: She is alert and oriented to person, place, and time.     GCS: GCS eye subscore is 4. GCS verbal subscore is 5. GCS motor subscore is 6.  Psychiatric:  Speech: Speech normal.        Behavior: Behavior normal. Behavior is cooperative.      UC Treatments / Results  Labs (all labs ordered are listed, but only abnormal results are displayed) Labs Reviewed  RESP PANEL BY RT-PCR (FLU A&B, COVID) [id number]    EKG   Radiology No  results found.  Procedures Procedures (including critical care time)  Medications Ordered in UC Medications - No data to display  Initial Impression / Assessment and Plan / UC Course  I have reviewed the triage vital signs and the nursing notes.  Pertinent labs & imaging results that were available during my care of the patient were reviewed by me and considered in my medical decision making (see chart for details).    Discussed exam findings and plan of care with patient, strict go to ER precautions given.   Patient verbalized understanding to this provider.  Ddx: Viral uri w cough, bronchitis, allergies Final Clinical Impressions(s) / UC Diagnoses   Final diagnoses:  Viral URI with cough     Discharge Instructions      Your covid and flu are negative, your oxygen is 100% on room air.   Most likely you have a viral illness: no antibiotic is indicated at this time, May treat with OTC meds of choice(delsym,mucinex,hot tea,etc). Make sure to drink plenty of fluids to stay hydrated(gatorade, water, popsicles,jello,etc), avoid caffeine products. Follow up with PCP. Return as needed.     ED Prescriptions   None    PDMP not reviewed this encounter.   Clancy Gourd, NP 12/05/23 [phone]

## 2023-12-06 ENCOUNTER — Ambulatory Visit: Payer: 59
